# Patient Record
Sex: Male | Born: 1950 | Race: White | Hispanic: No | Marital: Married | Smoking: Former smoker
Health system: Southern US, Community
[De-identification: ages and names within clinical notes are randomized; demographics above are authoritative.]

## PROBLEM LIST (undated history)

## (undated) DIAGNOSIS — H18529 Epithelial (juvenile) corneal dystrophy, unspecified eye: Secondary | ICD-10-CM

## (undated) DIAGNOSIS — H269 Unspecified cataract: Secondary | ICD-10-CM

## (undated) DIAGNOSIS — C44320 Squamous cell carcinoma of skin of unspecified parts of face: Secondary | ICD-10-CM

## (undated) DIAGNOSIS — H1852 Epithelial (juvenile) corneal dystrophy: Secondary | ICD-10-CM

## (undated) DIAGNOSIS — J449 Chronic obstructive pulmonary disease, unspecified: Secondary | ICD-10-CM

## (undated) DIAGNOSIS — M069 Rheumatoid arthritis, unspecified: Secondary | ICD-10-CM

## (undated) DIAGNOSIS — I10 Essential (primary) hypertension: Secondary | ICD-10-CM

## (undated) DIAGNOSIS — C44329 Squamous cell carcinoma of skin of other parts of face: Secondary | ICD-10-CM

## (undated) DIAGNOSIS — E320 Persistent hyperplasia of thymus: Secondary | ICD-10-CM

## (undated) DIAGNOSIS — M1990 Unspecified osteoarthritis, unspecified site: Secondary | ICD-10-CM

## (undated) DIAGNOSIS — J45909 Unspecified asthma, uncomplicated: Secondary | ICD-10-CM

## (undated) DIAGNOSIS — C61 Malignant neoplasm of prostate: Secondary | ICD-10-CM

## (undated) DIAGNOSIS — R0600 Dyspnea, unspecified: Secondary | ICD-10-CM

## (undated) HISTORY — PX: CATARACT EXTRACTION, BILATERAL: SHX1313

## (undated) HISTORY — DX: Unspecified asthma, uncomplicated: J45.909

## (undated) HISTORY — DX: Epithelial (juvenile) corneal dystrophy, unspecified eye: H18.529

## (undated) HISTORY — DX: Unspecified osteoarthritis, unspecified site: M19.90

## (undated) HISTORY — DX: Persistent hyperplasia of thymus: E32.0

## (undated) HISTORY — DX: Unspecified cataract: H26.9

## (undated) HISTORY — DX: Rheumatoid arthritis, unspecified: M06.9

## (undated) HISTORY — PX: EYE SURGERY: SHX253

## (undated) HISTORY — DX: Squamous cell carcinoma of skin of other parts of face: C44.329

## (undated) HISTORY — PX: OTHER SURGICAL HISTORY: SHX169

## (undated) HISTORY — DX: Epithelial (juvenile) corneal dystrophy: H18.52

## (undated) HISTORY — DX: Squamous cell carcinoma of skin of unspecified parts of face: C44.320

---

## 2013-08-28 HISTORY — PX: SUPERFICIAL KERATECTOMY: SHX2459

## 2014-06-08 ENCOUNTER — Telehealth: Payer: Self-pay | Admitting: Family Medicine

## 2014-06-08 ENCOUNTER — Ambulatory Visit (INDEPENDENT_AMBULATORY_CARE_PROVIDER_SITE_OTHER): Payer: BC Managed Care – PPO | Admitting: Family Medicine

## 2014-06-08 ENCOUNTER — Encounter: Payer: Self-pay | Admitting: Family Medicine

## 2014-06-08 VITALS — BP 122/76 | HR 73 | Temp 98.6°F | Ht 71.0 in | Wt 194.0 lb

## 2014-06-08 DIAGNOSIS — K429 Umbilical hernia without obstruction or gangrene: Secondary | ICD-10-CM

## 2014-06-08 DIAGNOSIS — H1859 Other hereditary corneal dystrophies: Secondary | ICD-10-CM

## 2014-06-08 DIAGNOSIS — N401 Enlarged prostate with lower urinary tract symptoms: Secondary | ICD-10-CM

## 2014-06-08 DIAGNOSIS — N138 Other obstructive and reflux uropathy: Secondary | ICD-10-CM

## 2014-06-08 DIAGNOSIS — M069 Rheumatoid arthritis, unspecified: Secondary | ICD-10-CM

## 2014-06-08 DIAGNOSIS — H18529 Epithelial (juvenile) corneal dystrophy, unspecified eye: Secondary | ICD-10-CM

## 2014-06-08 DIAGNOSIS — H1852 Epithelial (juvenile) corneal dystrophy: Secondary | ICD-10-CM

## 2014-06-08 LAB — HEPATIC FUNCTION PANEL
ALBUMIN: 3.7 g/dL (ref 3.5–5.2)
ALK PHOS: 115 U/L (ref 39–117)
ALT: 20 U/L (ref 0–53)
AST: 25 U/L (ref 0–37)
BILIRUBIN TOTAL: 0.9 mg/dL (ref 0.2–1.2)
Bilirubin, Direct: 0.1 mg/dL (ref 0.0–0.3)
Total Protein: 6.8 g/dL (ref 6.0–8.3)

## 2014-06-08 LAB — LIPID PANEL
CHOLESTEROL: 183 mg/dL (ref 0–200)
HDL: 33.5 mg/dL — AB (ref >39.00)
LDL Cholesterol: 136 mg/dL — ABNORMAL HIGH (ref 0–99)
NonHDL: 149.5
TRIGLYCERIDES: 69 mg/dL (ref 0.0–149.0)
Total CHOL/HDL Ratio: 5
VLDL: 13.8 mg/dL (ref 0.0–40.0)

## 2014-06-08 LAB — CBC WITH DIFFERENTIAL/PLATELET
BASOS PCT: 0.6 % (ref 0.0–3.0)
Basophils Absolute: 0 10*3/uL (ref 0.0–0.1)
Eosinophils Absolute: 0.1 10*3/uL (ref 0.0–0.7)
Eosinophils Relative: 1.6 % (ref 0.0–5.0)
HCT: 44.8 % (ref 39.0–52.0)
Hemoglobin: 15 g/dL (ref 13.0–17.0)
LYMPHS ABS: 3.4 10*3/uL (ref 0.7–4.0)
Lymphocytes Relative: 40.6 % (ref 12.0–46.0)
MCHC: 33.5 g/dL (ref 30.0–36.0)
MCV: 97.9 fl (ref 78.0–100.0)
MONO ABS: 0.9 10*3/uL (ref 0.1–1.0)
Monocytes Relative: 10.2 % (ref 3.0–12.0)
Neutro Abs: 3.9 10*3/uL (ref 1.4–7.7)
Neutrophils Relative %: 47 % (ref 43.0–77.0)
Platelets: 230 10*3/uL (ref 150.0–400.0)
RBC: 4.57 Mil/uL (ref 4.22–5.81)
RDW: 12.4 % (ref 11.5–15.5)
WBC: 8.3 10*3/uL (ref 4.0–10.5)

## 2014-06-08 LAB — POCT URINALYSIS DIPSTICK
BILIRUBIN UA: NEGATIVE
Glucose, UA: NEGATIVE
KETONES UA: NEGATIVE
Leukocytes, UA: NEGATIVE
Nitrite, UA: NEGATIVE
RBC UA: NEGATIVE
Spec Grav, UA: 1.015
Urobilinogen, UA: 0.2
pH, UA: 5.5

## 2014-06-08 LAB — BASIC METABOLIC PANEL
BUN: 13 mg/dL (ref 6–23)
CHLORIDE: 104 meq/L (ref 96–112)
CO2: 25 meq/L (ref 19–32)
CREATININE: 1 mg/dL (ref 0.4–1.5)
Calcium: 9.8 mg/dL (ref 8.4–10.5)
GFR: 78.83 mL/min (ref >60.00)
Glucose, Bld: 96 mg/dL (ref 70–99)
Potassium: 4.4 mEq/L (ref 3.5–5.1)
Sodium: 136 mEq/L (ref 135–145)

## 2014-06-08 LAB — TSH: TSH: 1.47 u[IU]/mL (ref 0.35–4.50)

## 2014-06-08 LAB — PSA: PSA: 1.79 ng/mL (ref 0.10–4.00)

## 2014-06-08 NOTE — Telephone Encounter (Signed)
emmi emailed °

## 2014-06-08 NOTE — Progress Notes (Signed)
   Subjective:    Patient ID: James [name], male    DOB: Jan 14, 1951, 63 y.o.   MRN: [id number]  HPI 63 yr old male to establish with [place] and to check an umbilical hernia. He has not had a primary care physician for over 30 years. He has had the hernia for about 5 years but it is getting larger and it gets partially incarcerated at times. It is not painful but he has trouble pushing it back in at times. He has RA which is controlled by taking glucosamine supplements only. He rarely has to take some Advil for pain.    Review of Systems  Constitutional: Negative.   Respiratory: Negative.   Cardiovascular: Negative.   Gastrointestinal: Negative.   Musculoskeletal: Positive for arthralgias. Negative for joint swelling.       Objective:   Physical Exam  Constitutional: He appears well-developed and well-nourished.  Cardiovascular: Normal rate, regular rhythm, normal heart sounds and intact distal pulses.   Pulmonary/Chest: Effort normal and breath sounds normal.  Abdominal: Soft. Bowel sounds are normal. He exhibits no distension. There is no tenderness. There is no rebound and no guarding.  Small non-tender reducible umbilical hernia          Assessment & Plan:  We will refer to Surgery for the hernia. He is fasting today so we will get cpx labs. Plan to do a cpx in the next few weeks

## 2014-06-08 NOTE — Progress Notes (Signed)
Pre visit review using our clinic review tool, if applicable. No additional management support is needed unless otherwise documented below in the visit note. 

## 2014-07-15 ENCOUNTER — Ambulatory Visit (INDEPENDENT_AMBULATORY_CARE_PROVIDER_SITE_OTHER): Payer: BC Managed Care – PPO | Admitting: Family Medicine

## 2014-07-15 ENCOUNTER — Encounter: Payer: Self-pay | Admitting: Family Medicine

## 2014-07-15 VITALS — BP 118/73 | HR 75 | Temp 98.4°F | Ht 71.0 in | Wt 192.0 lb

## 2014-07-15 DIAGNOSIS — Z0000 Encounter for general adult medical examination without abnormal findings: Secondary | ICD-10-CM

## 2014-07-15 NOTE — Progress Notes (Signed)
   Subjective:    Patient ID: James [name], male    DOB: June 28, 1951, 63 y.o.   MRN: [id number]  HPI 63 yr old male for a cpx. He feels well. He is scheduled for surgery to repair his umbilical hernia per Dr. Rosendo Gros on 08-07-14.    Review of Systems  Constitutional: Negative.   HENT: Negative.   Eyes: Negative.   Respiratory: Negative.   Cardiovascular: Negative.   Gastrointestinal: Negative.   Genitourinary: Negative.   Musculoskeletal: Negative.   Skin: Negative.   Neurological: Negative.   Psychiatric/Behavioral: Negative.        Objective:   Physical Exam  Constitutional: He is oriented to person, place, and time. He appears well-developed and well-nourished. No distress.  HENT:  Head: Normocephalic and atraumatic.  Right Ear: External ear normal.  Left Ear: External ear normal.  Nose: Nose normal.  Mouth/Throat: Oropharynx is clear and moist. No oropharyngeal exudate.  Eyes: Conjunctivae and EOM are normal. Pupils are equal, round, and reactive to light. Right eye exhibits no discharge. Left eye exhibits no discharge. No scleral icterus.  Neck: Neck supple. No JVD present. No tracheal deviation present. No thyromegaly present.  Cardiovascular: Normal rate, regular rhythm, normal heart sounds and intact distal pulses.  Exam reveals no gallop and no friction rub.   No murmur heard. EKG normal   Pulmonary/Chest: Effort normal and breath sounds normal. No respiratory distress. He has no wheezes. He has no rales. He exhibits no tenderness.  Abdominal: Soft. Bowel sounds are normal. He exhibits no distension. There is no tenderness. There is no rebound and no guarding.  Small reducible non-tender umbilical hernia   Genitourinary: Rectum normal, prostate normal and penis normal. Guaiac negative stool. No penile tenderness.  Musculoskeletal: Normal range of motion. He exhibits no edema or tenderness.  Lymphadenopathy:    He has no cervical adenopathy.  Neurological: He  is alert and oriented to person, place, and time. He has normal reflexes. No cranial nerve deficit. He exhibits normal muscle tone. Coordination normal.  Skin: Skin is warm and dry. No rash noted. He is not diaphoretic. No erythema. No pallor.  Psychiatric: He has a normal mood and affect. His behavior is normal. Judgment and thought content normal.          Assessment & Plan:  Well exam. Set up a colonoscopy

## 2014-07-15 NOTE — Progress Notes (Signed)
Pre visit review using our clinic review tool, if applicable. No additional management support is needed unless otherwise documented below in the visit note. 

## 2016-01-06 DIAGNOSIS — S50861A Insect bite (nonvenomous) of right forearm, initial encounter: Secondary | ICD-10-CM | POA: Diagnosis not present

## 2016-05-22 NOTE — Progress Notes (Signed)
[name] [place], [place] [address] Phone: [phone] Subjective:    I'm seeing this patient by the request  of: FRY,STEPHEN A, MD    CC: Left shoulder pain  [id number]  James [name] is a 65 y.o. male coming in with complaint of left shoulder pain. Patient was moving a treadmill down the stairs. Went and pivoted and felt a sharp pain in his left shoulder. Past medical history significant for a clavicle fracture noted and 20 years ago on this side. Patient states that unfortunately continues to have discomfort. States that certain movements, severe pain. Patient denies any radiation down the arm or any numbness. An some mild weakness but very minimal. Patient rates the severity of pain a 7 out of 10. Can wake him up at night.      Past Medical History:  Diagnosis Date  . Anterior basement membrane dystrophy    sees Dr. Vevelyn Royals   . Asthma    as a child, resolved   . Cataract   . 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 Rheumatoid arthritis   . 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 CATARACT EXTRACTION, BILATERAL     per Dr. Tommy Rainwater   . sqamous cell     right eyebrow excised   . SUPERFICIAL KERATECTOMY Right 2015   per Dr. Lucita Ferrara    Social History   Social History  . Marital status: Married    Spouse name: N/A  . Number of children: N/A  . Years of education: N/A   Social History Main Topics  . Smoking status: Current Every Day Smoker    Packs/day: 1.00    Types: Cigarettes  . Smokeless tobacco: Never Used  . Alcohol use 0.0 oz/week     Comment: rare  . Drug use: No  . Sexual activity: Not on file   Other Topics Concern  . Not on file   Social History Narrative  . No narrative on file   Allergies  Allergen Reactions  . Penicillins     Respiratory reaction SOB   Family History    Problem Relation Age of Onset  . Uterine cancer Mother   . Breast cancer Mother     Past medical history, social, surgical and family history all reviewed in electronic medical record.  No pertanent information unless stated regarding to the chief complaint.   Review of Systems: No headache, visual changes, nausea, vomiting, diarrhea, constipation, dizziness, abdominal pain, skin rash, fevers, chills, night sweats, weight loss, swollen lymph nodes, body aches, joint swelling, muscle aches, chest pain, shortness of breath, mood changes.   Objective  There were no vitals taken for this visit.  General: No apparent distress alert and oriented x3 mood and affect normal, dressed appropriately.  HEENT: Pupils equal, extraocular movements intact  Respiratory: Patient's speak in full sentences and does not appear short of breath  Cardiovascular: No lower extremity edema, non tender, no erythema  Skin: Warm dry intact with no signs of infection or rash on extremities or on axial skeleton.  Abdomen: Soft nontender  Neuro: Cranial nerves II through XII are intact, neurovascularly intact in all extremities with 2+ DTRs and 2+ pulses.  Lymph: No lymphadenopathy of posterior or anterior cervical chain or axillae bilaterally.  Gait normal with good balance and coordination.  MSK:  Non tender with full range of motion and good stability and symmetric strength and tone of elbows, wrist, hip, knee and ankles bilaterally.  Shoulder: left Inspection reveals no abnormalities, atrophy or asymmetry. Palpation is normal with no tenderness over AC joint or bicipital groove. ROM is full in all planes passively. Rotator cuff strength normal throughout. signs of impingement with positive Neer and Hawkin's tests, but negative empty can sign. Speeds and Yergason's tests normal. No labral pathology noted with negative Obrien's, negative clunk and good stability. Positive crossover test Normal scapular function  observed. No painful arc and no drop arm sign. No apprehension sign  MSK US performed of: left This study was ordered, performed, and interpreted by Charlann Boxer D.O.  Shoulder:   Supraspinatus:  Degenerative tear noted. No retraction. Mild increase in Doppler flow Infraspinatus:  Appears normal on long and transverse views. Significant increase in Doppler flow Subscapularis:  Appears normal on long and transverse views. Positive bursa Teres Minor:  Appears normal on long and transverse views. AC joint:  Severe capsulitis noted with hypoechoic changes Glenohumeral Joint:  Appears normal without effusion. Glenoid Labrum:  Intact without visualized tears. Biceps Tendon:  Appears normal on long and transverse views, no fraying of tendon, tendon located in intertubercular groove, no subluxation with shoulder internal or external rotation.  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  Procedure note [id number]; 15 minutes spent for Therapeutic exercises as stated in above notes.  This included exercises focusing on stretching, strengthening, with significant focus on eccentric aspects.  Shoulder Exercises that included:  Basic scapular stabilization to include adduction and depression of scapula Scaption, focusing on proper movement and good control Internal and External rotation utilizing a theraband, with elbow tucked at side entire time  Rows with theraband   Proper technique shown and discussed handout in great detail with ATC.  All questions were discussed and answered.     Impression and Recommendations:     This case required medical decision making of moderate complexity.      Note: This dictation was prepared with Dragon dictation along with smaller phrase technology. Any transcriptional errors that result from this process are unintentional.

## 2016-05-23 ENCOUNTER — Other Ambulatory Visit: Payer: Self-pay

## 2016-05-23 ENCOUNTER — Ambulatory Visit (INDEPENDENT_AMBULATORY_CARE_PROVIDER_SITE_OTHER): Payer: BLUE CROSS/BLUE SHIELD | Admitting: Family Medicine

## 2016-05-23 ENCOUNTER — Encounter: Payer: Self-pay | Admitting: Family Medicine

## 2016-05-23 VITALS — BP 136/80 | HR 78 | Wt 192.0 lb

## 2016-05-23 DIAGNOSIS — M25512 Pain in left shoulder: Secondary | ICD-10-CM

## 2016-05-23 DIAGNOSIS — M25519 Pain in unspecified shoulder: Secondary | ICD-10-CM | POA: Diagnosis not present

## 2016-05-23 DIAGNOSIS — M75102 Unspecified rotator cuff tear or rupture of left shoulder, not specified as traumatic: Secondary | ICD-10-CM | POA: Diagnosis not present

## 2016-05-23 DIAGNOSIS — M7542 Impingement syndrome of left shoulder: Secondary | ICD-10-CM

## 2016-05-23 NOTE — Assessment & Plan Note (Signed)
Patient does have what appears to be acromial clavicular separation. Patient has elected try conservative therapy. Work with Product/process development scientist to learn home exercises in greater detail. Declined injection today. We discussed icing and given topical anti-inflammatories. Patient continuing to stay active. Patient come back and see me again in 3-4 weeks. Forcing symptoms consider injection.

## 2016-05-23 NOTE — Patient Instructions (Signed)
Good to see you.  Ice 20 minutes 2 times daily. Usually after activity and before bed. Exercises 3 times a week.  pennsaid pinkie amount topically 2 times daily as needed.  Vitamin D 2000 IU daily  Turmeric 500mg  twice daily  Keep hands within peripheral vision.  Avoid heavy lifting See me again in 3-4 weeks and if worsening we will consider injection.

## 2016-06-13 NOTE — Progress Notes (Signed)
[name] [place], [place] [address] Phone: [phone] Subjective:    I'm seeing this patient by the request  of: FRY,STEPHEN A, MD    CC: Left shoulder pain f/u  [id number]  James [name] is a 65 y.o. male coming in with complaint of left shoulder pain. Patient was found to have a degenerative tear of the meniscus as well as acromial clavicular arthritis with synovitis. Patient elected to try conservative therapy with home exercise, icing protocol, and range of motion exercises. Past medical history significant for rheumatoid arthritis. Patient states still having the pain. Did not do the exercises at all. Has been doing the icing and some of the topical anti-inflammatories. Minimal change from previous exam.      Past Medical History:  Diagnosis Date  . Anterior basement membrane dystrophy    sees Dr. Vevelyn Royals   . Asthma    as a child, resolved   . Cataract   . 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 Rheumatoid arthritis ([place])   . 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 CATARACT EXTRACTION, BILATERAL     per Dr. Tommy Rainwater   . sqamous cell     right eyebrow excised   . SUPERFICIAL KERATECTOMY Right 2015   per Dr. Lucita Ferrara    Social History   Social History  . Marital status: Married    Spouse name: N/A  . Number of children: N/A  . Years of education: N/A   Social History Main Topics  . Smoking status: Current Every Day Smoker    Packs/day: 1.00    Types: Cigarettes  . Smokeless tobacco: Never Used  . Alcohol use 0.0 oz/week     Comment: rare  . Drug use: No  . Sexual activity: Not Asked   Other Topics Concern  . None   Social History Narrative  . None   Allergies  Allergen Reactions  . Penicillins     Respiratory reaction SOB   Family History  Problem Relation Age of  Onset  . Uterine cancer Mother   . Breast cancer Mother     Past medical history, social, surgical and family history all reviewed in electronic medical record.  No pertanent information unless stated regarding to the chief complaint.   Review of Systems: No headache, visual changes, nausea, vomiting, diarrhea, constipation, dizziness, abdominal pain, skin rash, fevers, chills, night sweats, weight loss, swollen lymph nodes, body aches, joint swelling, muscle aches, chest pain, shortness of breath, mood changes.   Objective  Blood pressure 122/72, pulse 77, weight 188 lb (85.3 kg), SpO2 97 %.  General: No apparent distress alert and oriented x3 mood and affect normal, dressed appropriately.  HEENT: Pupils equal, extraocular movements intact  Respiratory: Patient's speak in full sentences and does not appear short of breath  Cardiovascular: No lower extremity edema, non tender, no erythema  Skin: Warm dry intact with no signs of infection or rash on extremities or on axial skeleton.  Abdomen: Soft nontender  Neuro: Cranial nerves II through XII are intact, neurovascularly intact in all extremities with 2+ DTRs and 2+ pulses.  Lymph: No lymphadenopathy of posterior or anterior cervical chain or axillae bilaterally.  Gait normal with good balance and coordination.  MSK:  Non tender with full range of motion and good stability  and symmetric strength and tone of elbows, wrist, hip, knee and ankles bilaterally.  Shoulder: left Inspection reveals no abnormalities, atrophy or asymmetry. Increasing tenderness over the acromial clavicular joint Full range of motion but does have crepitus 4+ out of 5 compared to contralateral side. New finding signs of impingement with positive Neer and Hawkin's tests, but negative empty can sign. Speeds and Yergason's tests normal. No labral pathology noted with negative Obrien's, negative clunk and good stability. Positive crossover test Normal scapular function  observed. No painful arc and no drop arm sign. No apprehension sign Contralateral shoulder unremarkable  MSK US performed of: left This study was ordered, performed, and interpreted by Charlann Boxer D.O.  Shoulder:   Supraspinatus:  Degenerative tear noted. No retraction. Mild increase in Doppler flow Infraspinatus:  Appears normal on long and transverse views. Significant increase in Doppler flow Subscapularis:  Appears normal on long and transverse views. Positive bursa Teres Minor:  Appears normal on long and transverse views. AC joint:  Severe capsulitis noted with hypoechoic changes Glenohumeral Joint:  Appears normal without effusion. Glenoid Labrum:  Intact without visualized tears. Biceps Tendon:  Appears normal on long and transverse views, no fraying of tendon, tendon located in intertubercular groove, no subluxation with shoulder internal or external rotation.  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   Procedure: Real-time Ultrasound Guided Injection of left acromioclavicular joint Device: GE Logiq E  Ultrasound guided injection is preferred based studies that show increased duration, increased effect, greater accuracy, decreased procedural pain, increased response rate, and decreased cost with ultrasound guided versus blind injection.  Verbal informed consent obtained.  Time-out conducted.  Noted no overlying erythema, induration, or other signs of local infection.  Skin prepped in a sterile fashion.  Local anesthesia: Topical Ethyl chloride.  With sterile technique and under real time ultrasound guidance:  With a 25-gauge half-inch needle was injected with a total of 0.5 mL of 0.5% Marcaine and 0.5 mL of Kenalog 40 mg/dL. Completed without difficulty  Pain immediately resolved suggesting accurate placement of the medication.  Advised to call if fevers/chills, erythema, induration, drainage, or persistent  bleeding.  Images permanently stored and available for review in the ultrasound unit.  Impression: Technically successful ultrasound guided injection.   Impression and Recommendations:     This case required medical decision making of moderate complexity.      Note: This dictation was prepared with Dragon dictation along with smaller phrase technology. Any transcriptional errors that result from this process are unintentional.

## 2016-06-14 ENCOUNTER — Ambulatory Visit (INDEPENDENT_AMBULATORY_CARE_PROVIDER_SITE_OTHER)
Admission: RE | Admit: 2016-06-14 | Discharge: 2016-06-14 | Disposition: A | Discharge status: Home / Self Care | Payer: BLUE CROSS/BLUE SHIELD | Source: Ambulatory Visit | Attending: Family Medicine | Admitting: Family Medicine

## 2016-06-14 ENCOUNTER — Ambulatory Visit: Payer: Self-pay

## 2016-06-14 ENCOUNTER — Ambulatory Visit (INDEPENDENT_AMBULATORY_CARE_PROVIDER_SITE_OTHER): Payer: BLUE CROSS/BLUE SHIELD | Admitting: Family Medicine

## 2016-06-14 ENCOUNTER — Encounter: Payer: Self-pay | Admitting: Family Medicine

## 2016-06-14 VITALS — BP 122/72 | HR 77 | Wt 188.0 lb

## 2016-06-14 DIAGNOSIS — M25512 Pain in left shoulder: Secondary | ICD-10-CM

## 2016-06-14 DIAGNOSIS — M7542 Impingement syndrome of left shoulder: Secondary | ICD-10-CM | POA: Diagnosis not present

## 2016-06-14 DIAGNOSIS — S4992XA Unspecified injury of left shoulder and upper arm, initial encounter: Secondary | ICD-10-CM | POA: Diagnosis not present

## 2016-06-14 IMAGING — DX DG SHOULDER 2+V*L*
3 series · 3 of 3 positions shown · non-contrast
Comparison: None.

CLINICAL DATA: Pain following injury 2 months prior.

EXAM:
LEFT SHOULDER - 2+ VIEW

[grashey]
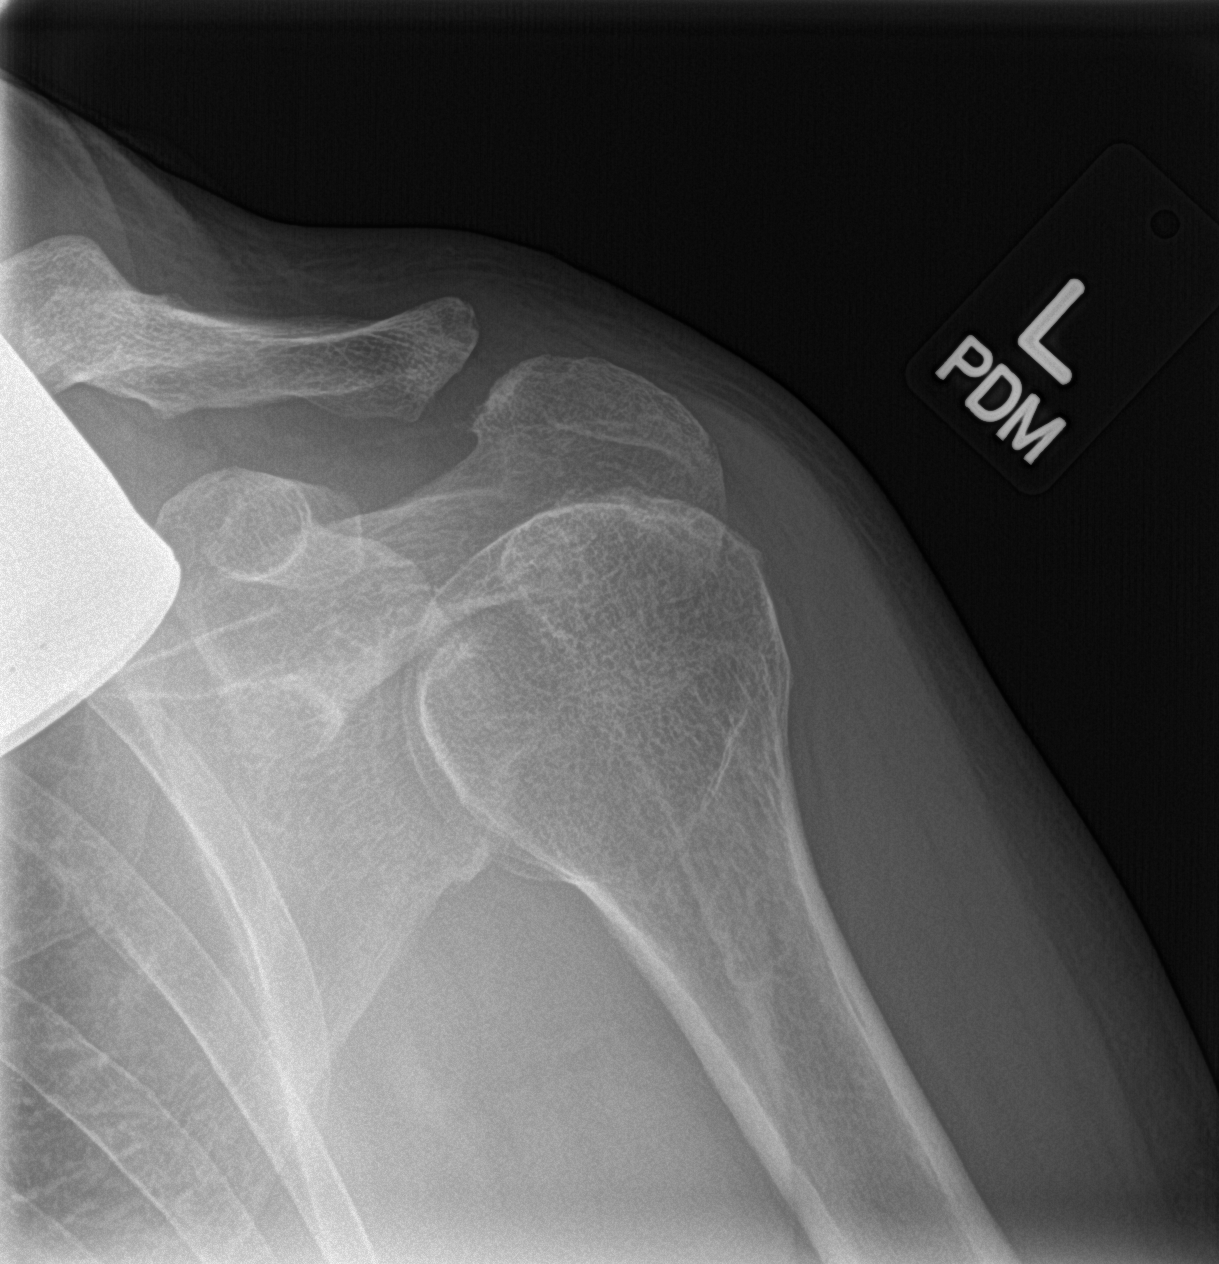

[y view]
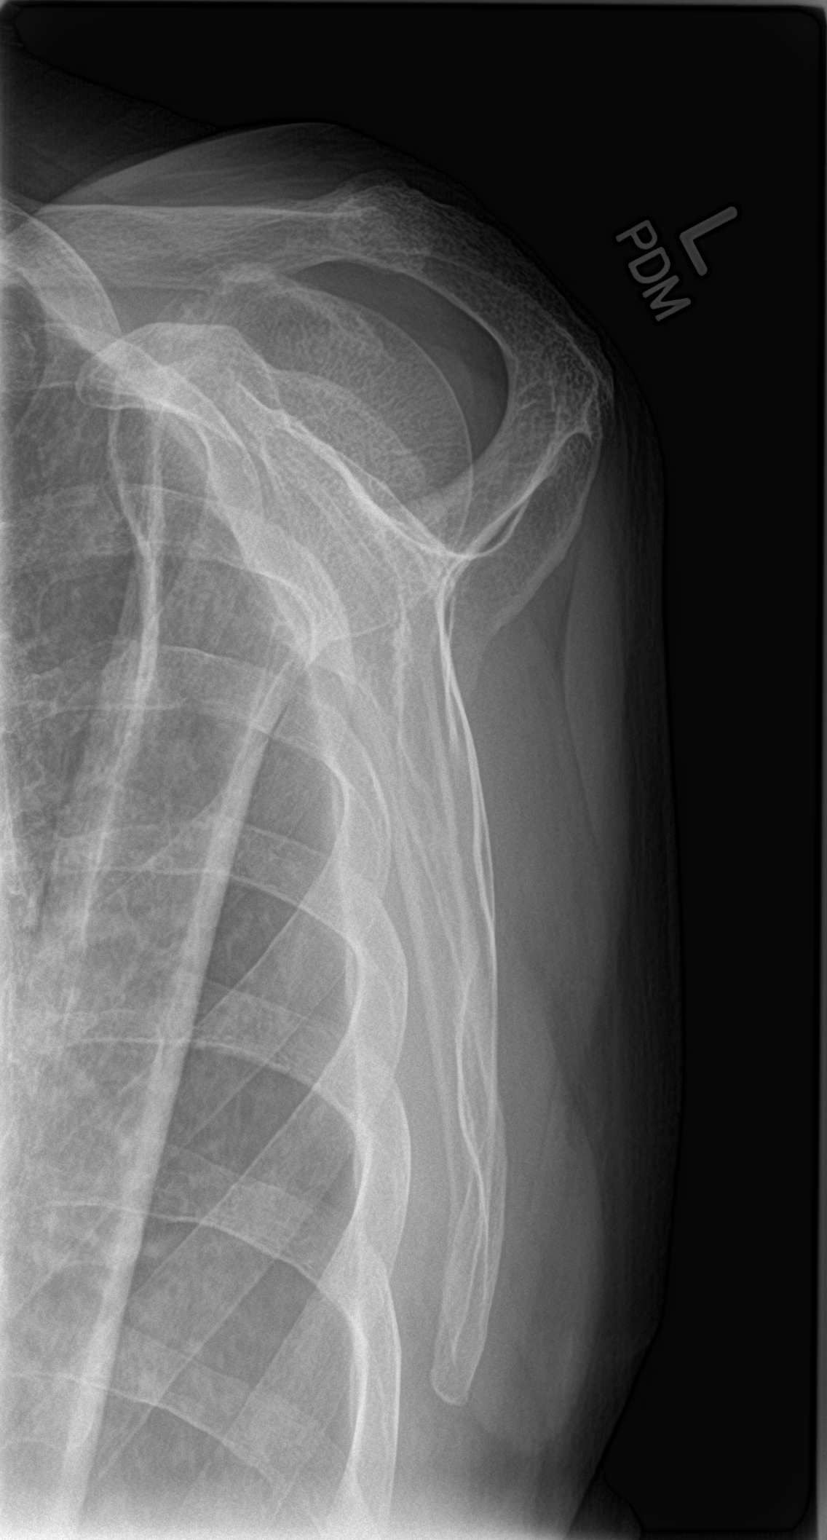

[shoulder axial]
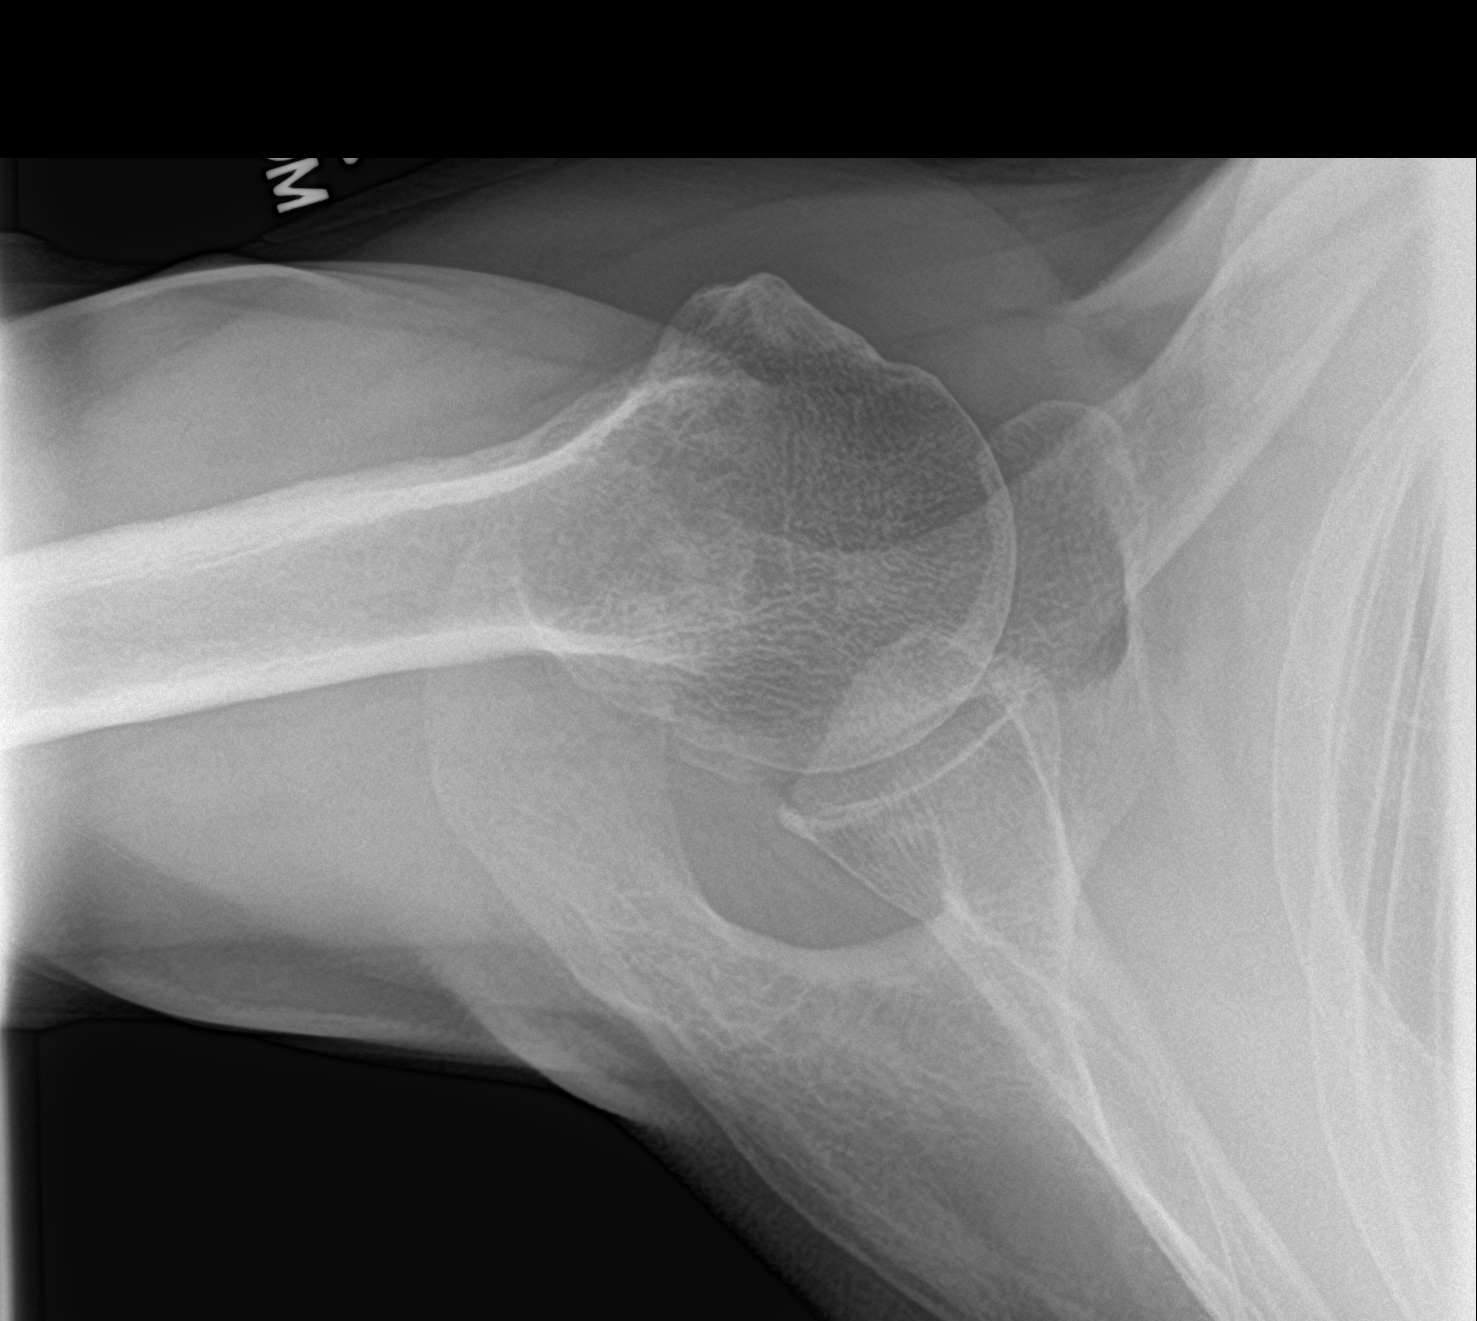

[3 of 3 positions shown; findings below may reference images not displayed]

FINDINGS: Frontal, Y scapular, and axillary images were obtained. There is no
fracture or dislocation. Joint spaces appear unremarkable. No
erosive change. Visualized left lung clear.
IMPRESSION: Fracture or dislocation.  No evident arthropathy.

## 2016-06-14 NOTE — Assessment & Plan Note (Signed)
Given injection today and tolerated the procedure well. We discussed icing regimen and home exercises. We discussed which activities to do a which ones to avoid. Patient will continue to stay active. Follow-up again in 4 weeks. If worsening symptoms consider formal physical therapy or intra-articular injection secondary to rotator cuff degenerative tear.  If weakness would need advance imaging.

## 2016-06-14 NOTE — Patient Instructions (Addendum)
Good to see you  We will get xray downstairs Try to be a little more diligently on the exercises Injected AC joint today and may need the shoulder injected in the long run.  See me again in 4 weeks.

## 2016-07-11 NOTE — Progress Notes (Signed)
[name] [place], [place] [address] Phone: [phone] Subjective:    I'm seeing this patient by the request  of: FRY,STEPHEN A, MD    CC: Left shoulder pain f/u  [id number]  James [name] is a 65 y.o. male coming in with complaint of left shoulder pain. Patient was found to have a degenerative tear of the Rotator cuff as well as acromial clavicular arthritis with synovitis. Patient will remain mild improvement with conservative therapy and then elected to have an acromioclavicular injection. Patient states mild improvement.  No new symptoms still hurt with some activity Still doing some discomfort when having pain at night.       Past Medical History:  Diagnosis Date  . Anterior basement membrane dystrophy    sees Dr. Vevelyn Royals   . Asthma    as a child, resolved   . Cataract   . 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 Rheumatoid arthritis ([place])   . 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 CATARACT EXTRACTION, BILATERAL     per Dr. Tommy Rainwater   . sqamous cell     right eyebrow excised   . SUPERFICIAL KERATECTOMY Right 2015   per Dr. Lucita Ferrara    Social History   Social History  . Marital status: Married    Spouse name: N/A  . Number of children: N/A  . Years of education: N/A   Social History Main Topics  . Smoking status: Current Every Day Smoker    Packs/day: 1.00    Types: Cigarettes  . Smokeless tobacco: Never Used  . Alcohol use 0.0 oz/week     Comment: rare  . Drug use: No  . Sexual activity: Not Asked   Other Topics Concern  . None   Social History Narrative  . None   Allergies  Allergen Reactions  . Penicillins     Respiratory reaction SOB   Family History  Problem Relation Age of Onset  . Uterine cancer Mother   . Breast cancer Mother     Past medical history,  social, surgical and family history all reviewed in electronic medical record.  No pertanent information unless stated regarding to the chief complaint.   Review of Systems: No headache, visual changes, nausea, vomiting, diarrhea, constipation, dizziness, abdominal pain, skin rash, fevers, chills, night sweats, weight loss, swollen lymph nodes, body aches, joint swelling, muscle aches, chest pain, shortness of breath, mood changes.   Objective  Blood pressure 128/74, pulse 81, height 5\' 10"  (1.778 m), weight 190 lb (86.2 kg), SpO2 95 %.  Systems examined below as of 07/12/16 General: NAD A&O x3 mood, affect normal  HEENT: Pupils equal, extraocular movements intact no nystagmus Respiratory: not short of breath at rest or with speaking Cardiovascular: No lower extremity edema, non tender Skin: Warm dry intact with no signs of infection or rash on extremities or on axial skeleton. Abdomen: Soft nontender, no masses Neuro: Cranial nerves  intact, neurovascularly intact in all extremities with 2+ DTRs and 2+ pulses. Lymph: No lymphadenopathy appreciated today  Gait normal with good balance and coordination.  MSK: Non tender with full range of motion and good stability and symmetric strength and tone of elbows, wrist,  knee hips and ankles bilaterally.   Shoulder: left Patient does have asymmetry of the clavicle on  the left side Significant decrease tenderness over the acromioclavicular joint Full range of motion but does have crepitus 4+ out of 5 compared to contralateral side. . No significant improvement signs of impingement with positive Neer and Hawkin's tests, but negative empty can sign. This is worse than previous exam Speeds and Yergason's tests normal. No labral pathology noted with negative Obrien's, negative clunk and good stability. Positive crossover test Normal scapular function observed. No painful arc and no drop arm sign. No apprehension sign Contralateral shoulder  unremarkable Worsening impingement signs  Procedure: Real-time Ultrasound Guided Injection of left glenohumeral joint Device: GE Logiq E  Ultrasound guided injection is preferred based studies that show increased duration, increased effect, greater accuracy, decreased procedural pain, increased response rate with ultrasound guided versus blind injection.  Verbal informed consent obtained.  Time-out conducted.  Noted no overlying erythema, induration, or other signs of local infection.  Skin prepped in a sterile fashion.  Local anesthesia: Topical Ethyl chloride.  With sterile technique and under real time ultrasound guidance:  Joint visualized.  23g 1  inch needle inserted posterior approach. Pictures taken for needle placement. Patient did have injection of 2 cc of 1% lidocaine, 2 cc of 0.5% Marcaine, and 1cc of Kenalog 40 mg/dL. Completed without difficulty  Pain immediately resolved suggesting accurate placement of the medication.  Advised to call if fevers/chills, erythema, induration, drainage, or persistent bleeding.  Images permanently stored and available for review in the ultrasound unit.  Impression: Technically successful ultrasound guided injection.     Impression and Recommendations:     This case required medical decision making of moderate complexity.      Note: This dictation was prepared with Dragon dictation along with smaller phrase technology. Any transcriptional errors that result from this process are unintentional.

## 2016-07-12 ENCOUNTER — Encounter: Payer: Self-pay | Admitting: Family Medicine

## 2016-07-12 ENCOUNTER — Ambulatory Visit (INDEPENDENT_AMBULATORY_CARE_PROVIDER_SITE_OTHER): Payer: BLUE CROSS/BLUE SHIELD | Admitting: Family Medicine

## 2016-07-12 ENCOUNTER — Ambulatory Visit: Payer: Self-pay

## 2016-07-12 VITALS — BP 128/74 | HR 81 | Ht 70.0 in | Wt 190.0 lb

## 2016-07-12 DIAGNOSIS — M7542 Impingement syndrome of left shoulder: Secondary | ICD-10-CM

## 2016-07-12 DIAGNOSIS — M25512 Pain in left shoulder: Secondary | ICD-10-CM | POA: Diagnosis not present

## 2016-07-12 NOTE — Assessment & Plan Note (Signed)
On exam patient seemed to be improving with this. Unfortunately having worsening impingement. I do believe that this is contributing to most of his discomfort. Patient does have some underlying osteophytic changes of the shoulder that likely also be playing a role. Patient has a history of rheumatoid arthritis and will continue to monitor. Discussed the over-the-counter medications in the topical anti-inflammatories. Patient and will come back and see me again in 4 weeks for further evaluation. Worsening symptoms consider formal physical therapy or MRI.

## 2016-07-12 NOTE — Assessment & Plan Note (Addendum)
Worsening symptoms. Patient given injection today, discussed icing regimen and home exercises, we discussed which activities to do in which ones to potentially avoid. Patient will continue to be active otherwise. Patient will follow-up with me again in 4-6 weeks. Worsening symptoms advance imaging or formal physical therapy could be warranted.

## 2016-07-12 NOTE — Patient Instructions (Signed)
Good to see you  Lets try this other injection.  Stay with the exercises 3 times a week, I like discomfort but not pain  I would only ice after a lot of activity  Have a great [place] day  See me again in 4-6 weeks to make sure doing well.

## 2016-08-08 NOTE — Progress Notes (Signed)
[name] [place], [place] [address] Phone: [phone] Subjective:    I'm seeing this patient by the request  of: FRY,STEPHEN A, MD    CC: Left shoulder pain f/u  [id number]  James [name] is a 65 y.o. male coming in with complaint of left shoulder pain. Patient was found to have a degenerative tear of the Rotator cuff as well as acromial clavicular arthritis with synovitis. Patient did have acromioclavicular injection 06/14/2016. Continued have pain and injected shoulder 07/12/2016. Patient was to do conservative therapy. Patient states better still mild pain, mild weakness compared to contralateral side      previous x-rays show no significant bony abnormalities of the left shoulder. These were independently visualized by me.  Past Medical History:  Diagnosis Date  . Anterior basement membrane dystrophy    sees Dr. Vevelyn Royals   . Asthma    as a child, resolved   . Cataract   . 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 Rheumatoid arthritis ([place])   . 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 CATARACT EXTRACTION, BILATERAL     per Dr. Tommy Rainwater   . sqamous cell     right eyebrow excised   . SUPERFICIAL KERATECTOMY Right 2015   per Dr. Lucita Ferrara    Social History   Social History  . Marital status: Married    Spouse name: N/A  . Number of children: N/A  . Years of education: N/A   Social History Main Topics  . Smoking status: Current Every Day Smoker    Packs/day: 1.00    Types: Cigarettes  . Smokeless tobacco: Never Used  . Alcohol use 0.0 oz/week     Comment: rare  . Drug use: No  . Sexual activity: Not Asked   Other Topics Concern  . None   Social History Narrative  . None   Allergies  Allergen Reactions  . Penicillins     Respiratory reaction SOB   Family History  Problem  Relation Age of Onset  . Uterine cancer Mother   . Breast cancer Mother     Past medical history, social, surgical and family history all reviewed in electronic medical record.  No pertanent information unless stated regarding to the chief complaint.   Review of Systems: No headache, visual changes, nausea, vomiting, diarrhea, constipation, dizziness, abdominal pain, skin rash, fevers, chills, night sweats, weight loss, swollen lymph nodes, body aches, joint swelling, muscle aches, chest pain, shortness of breath, mood changes.     Objective  Blood pressure 128/80, pulse 80, height 5\' 10"  (1.778 m), weight 189 lb (85.7 kg), SpO2 95 %.  Systems examined below as of 08/09/16 General: NAD A&O x3 mood, affect normal  HEENT: Pupils equal, extraocular movements intact no nystagmus Respiratory: not short of breath at rest or with speaking Cardiovascular: No lower extremity edema, non tender Skin: Warm dry intact with no signs of infection or rash on extremities or on axial skeleton. Abdomen: Soft nontender, no masses Neuro: Cranial nerves  intact, neurovascularly intact in all extremities with 2+ DTRs and 2+ pulses. Lymph: No lymphadenopathy appreciated today  Gait normal with good balance and coordination.  MSK: Non tender with full range of motion and good stability and symmetric strength and tone of  elbows, wrist,  knee hips and ankles  bilaterally.    Shoulder: left Patient does have asymmetry of the clavicle on the left side Mild pain over the acromioclavicular joint.  4+ out of 5 compared to contralateral side. Marland Kitchen No change in stregth signs of impingement with positive Neer and Hawkin's tests, but negative empty can sign. This is worse than previous exam Speeds and Yergason's tests normal. No labral pathology noted with negative Obrien's, negative clunk and good stability. Positive crossover test still present.  Normal scapular function observed. No painful arc and no drop arm  sign. No apprehension sign Contralateral shoulder unremarkable        Impression and Recommendations:     This case required medical decision making of moderate complexity.      Note: This dictation was prepared with Dragon dictation along with smaller phrase technology. Any transcriptional errors that result from this process are unintentional.

## 2016-08-09 ENCOUNTER — Ambulatory Visit (INDEPENDENT_AMBULATORY_CARE_PROVIDER_SITE_OTHER): Payer: BLUE CROSS/BLUE SHIELD | Admitting: Family Medicine

## 2016-08-09 ENCOUNTER — Encounter: Payer: Self-pay | Admitting: Family Medicine

## 2016-08-09 DIAGNOSIS — M7542 Impingement syndrome of left shoulder: Secondary | ICD-10-CM | POA: Diagnosis not present

## 2016-08-09 MED ORDER — DICLOFENAC SODIUM 2 % TD SOLN: TRANSDERMAL | 3 refills | Status: DC

## 2016-08-09 NOTE — Assessment & Plan Note (Signed)
Partial tearing, OA and RA causing pain  Will continue therapy conservative.  Can repeat injection as needed every 3 months, likely not a surgical candidate due to OA.  F/u again in 2 months

## 2016-08-09 NOTE — Patient Instructions (Signed)
Good to see you  Ice 20 minutes 2 times daily. Usually after activity and before bed. Stay active but keep hands within peripheral vision.  Avoid heavy lifting. pennsaid pinkie amount topically 2 times daily as needed.   You will do well but we may need to inject in 8 weeks if it worsens.

## 2016-09-05 ENCOUNTER — Telehealth: Payer: Self-pay | Admitting: Family Medicine

## 2016-09-05 MED ORDER — DICLOFENAC SODIUM 2 % TD SOLN: TRANSDERMAL | 3 refills | Status: DC

## 2016-09-05 NOTE — Telephone Encounter (Signed)
Pt request to speak with the assistant concern about pennsaid. Please give him a call back

## 2016-09-05 NOTE — Telephone Encounter (Signed)
Re-sent rx into pharmacy.  

## 2016-10-09 NOTE — Progress Notes (Signed)
[name] [place], [place] [address] Phone: [phone] Subjective:    I'm seeing this patient by the request  of: James Penna, MD    CC: Left shoulder pain f/u  [id number]  James [name] is a 66 y.o. male coming in with complaint of left shoulder pain. Patient was found to have a degenerative tear of the Rotator cuff as well as acromial clavicular arthritis with synovitis. Patient did have acromioclavicular injection 06/14/2016. Continued have pain and injected shoulder 07/12/2016. Patient was to do conservative therapy. Update 10/10/16-  Patient seems to be doing relatively well. Patient states that he is having some mild pain. Nothing severe. Patient is starting to increase activity on a more regular basis. Patient states that he has tried to start doing some strengthening with some mild discomfort but nothing severe. Not affecting daily activities.      Past Medical History:  Diagnosis Date  . Anterior basement membrane dystrophy    sees Dr. Vevelyn Royals   . Asthma    as a child, resolved   . Cataract   . 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 Rheumatoid arthritis ([place])   . 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 CATARACT EXTRACTION, BILATERAL     per Dr. Tommy Rainwater   . sqamous cell     right eyebrow excised   . SUPERFICIAL KERATECTOMY Right 2015   per Dr. Lucita Ferrara    Social History   Social History  . Marital status: Married    Spouse name: N/A  . Number of children: N/A  . Years of education: N/A   Social History Main Topics  . Smoking status: Current Every Day Smoker    Packs/day: 1.00    Types: Cigarettes  . Smokeless tobacco: Never Used  . Alcohol use 0.0 oz/week     Comment: rare  . Drug use: No  . Sexual activity: Not Asked   Other Topics Concern  . None   Social History  Narrative  . None   Allergies  Allergen Reactions  . Penicillins     Respiratory reaction SOB   Family History  Problem Relation Age of Onset  . Uterine cancer Mother   . Breast cancer Mother     Past medical history, social, surgical and family history all reviewed in electronic medical record.  No pertanent information unless stated regarding to the chief complaint.   Review of Systems: No headache, visual changes, nausea, vomiting, diarrhea, constipation, dizziness, abdominal pain, skin rash, fevers, chills, night sweats, weight loss, swollen lymph nodes, body aches, joint swelling, muscle aches, chest pain, shortness of breath, mood changes.       Objective  Blood pressure 108/78, pulse 76, height 5\' 10"  (1.778 m), weight 195 lb (88.5 kg), SpO2 98 %.  Systems examined below as of 10/10/16 General: NAD A&O x3 mood, affect normal  HEENT: Pupils equal, extraocular movements intact no nystagmus Respiratory: not short of breath at rest or with speaking Cardiovascular: No lower extremity edema, non tender Skin: Warm dry intact with no signs of infection or rash on extremities or on axial skeleton. Abdomen: Soft nontender, no masses Neuro: Cranial nerves  intact, neurovascularly intact in all extremities with 2+ DTRs and 2+ pulses. Lymph: No lymphadenopathy appreciated today  Gait normal with good balance and coordination.  MSK: Non tender with full range of motion and good stability and symmetric strength and tone of , elbows, wrist,  knee hips and ankles bilaterally.      Shoulder: Inspection reveals no abnormalities, atrophy or asymmetry. Palpation is normal with no tenderness over AC joint or bicipital groove. ROM is full in all planes. Rotator cuff strength 4+ out of 5 compared to contralateral sign. No signs of impingement with negative Neer and Hawkin's tests, empty can sign. Speeds and Yergason's tests normal. Mild positive labral pathology patient does have positive  crossover Normal scapular function observed. No painful arc and no drop arm sign. No apprehension sign           Impression and Recommendations:     This case required medical decision making of moderate complexity.      Note: This dictation was prepared with Dragon dictation along with smaller phrase technology. Any transcriptional errors that result from this process are unintentional.

## 2016-10-10 ENCOUNTER — Ambulatory Visit (INDEPENDENT_AMBULATORY_CARE_PROVIDER_SITE_OTHER): Payer: BLUE CROSS/BLUE SHIELD | Admitting: Family Medicine

## 2016-10-10 ENCOUNTER — Encounter: Payer: Self-pay | Admitting: Family Medicine

## 2016-10-10 VITALS — BP 108/78 | HR 76 | Ht 70.0 in | Wt 195.0 lb

## 2016-10-10 DIAGNOSIS — M25512 Pain in left shoulder: Secondary | ICD-10-CM | POA: Diagnosis not present

## 2016-10-10 DIAGNOSIS — M7542 Impingement syndrome of left shoulder: Secondary | ICD-10-CM

## 2016-10-10 NOTE — Assessment & Plan Note (Signed)
Patient seems to be doing relatively well. We discussed the possibility of a repeat injection patient declined. We discussed icing regimen. We discussed continuing exercises and patient given more specific strengthening exercises with a very slow increasing weight. We discussed the progression been very important. Patient will continue to try to increase activity. We'll monitor for any type of flares of the rheumatoid arthritis or any increasing weakness. We discussed different diet changes and over-the-counter medications in great detail. Patient will follow-up again with me more on an as-needed basis as long as patient does well.  Spent  25 minutes with patient face-to-face and had greater than 50% of counseling including as described above in assessment and plan.

## 2016-10-10 NOTE — Patient Instructions (Signed)
great to see you  Ice is your friend Continue the vitamins  Continue the pennsaid Start increasing activity as tolerated and 50% weight increasing 5% every 1-2 weeks.  See me when you need me

## 2016-10-17 DIAGNOSIS — Z720 Tobacco use: Secondary | ICD-10-CM | POA: Diagnosis not present

## 2016-12-19 ENCOUNTER — Telehealth: Payer: Self-pay | Admitting: Family Medicine

## 2016-12-19 DIAGNOSIS — D235 Other benign neoplasm of skin of trunk: Secondary | ICD-10-CM | POA: Diagnosis not present

## 2016-12-19 DIAGNOSIS — L578 Other skin changes due to chronic exposure to nonionizing radiation: Secondary | ICD-10-CM | POA: Diagnosis not present

## 2016-12-19 DIAGNOSIS — L2084 Intrinsic (allergic) eczema: Secondary | ICD-10-CM | POA: Diagnosis not present

## 2016-12-19 NOTE — Telephone Encounter (Signed)
I added all 3 supplements to current medication list.

## 2016-12-19 NOTE — Telephone Encounter (Signed)
Pt would like his med list to include and be updated with the following added:  Saw palmetto complex  320 mg Tumeric  1000 mg Vitamin d  1000 iu  Pt seeing dermatologist today and wanted his med list updated so they can request.

## 2016-12-22 DIAGNOSIS — L538 Other specified erythematous conditions: Secondary | ICD-10-CM | POA: Diagnosis not present

## 2016-12-22 DIAGNOSIS — D235 Other benign neoplasm of skin of trunk: Secondary | ICD-10-CM | POA: Diagnosis not present

## 2017-10-23 ENCOUNTER — Telehealth: Payer: Self-pay | Admitting: Family Medicine

## 2017-10-23 ENCOUNTER — Other Ambulatory Visit: Payer: Self-pay | Admitting: Family Medicine

## 2017-10-23 ENCOUNTER — Other Ambulatory Visit: Payer: Self-pay

## 2017-10-23 MED ORDER — DICLOFENAC SODIUM 2 % TD SOLN: TRANSDERMAL | 3 refills | Status: DC

## 2017-10-23 NOTE — Telephone Encounter (Signed)
Copied from [place]. Topic: Quick Communication - Rx Refill/Question >> Oct 23, 2017  1:04 PM Scherrie Gerlach wrote: Medication: Diclofenac Sodium (PENNSAID) 2 % SOLN Pt would like 3 refills or a year supply  Has the patient contacted their pharmacy? {yes was told this Rx was expired  This was prescribed by Dr Arnetha Gula [facility], [place], [place] - [address] [phone] (Phone) [phone] (Fax)

## 2017-10-24 NOTE — Telephone Encounter (Signed)
Contacted the pt.  Advised that Dr. Hulan Saas has refilled the Diclofenac Sodium 2 % Soln.; advised he wrote the Rx 2/26 and gave 3 refills, but that the pt. needs to schedule an office appt., before any further Rx will be written for this pt.  Verb. Understanding.  Stated he rec'd. a phone call from [facility] this morning and "the transaction has been completed."  Advised the pt. the Rx was sent to [facility], [place], [place].  The pt. does not think the Rx went to the [facility].  Reported that he isn't sure if he spoke with [place].  Stated he will call [facility] and inquire if the Rx went there.

## 2017-10-24 NOTE — Telephone Encounter (Signed)
Patient calling back. Wanted to let nurse that order is handled through [facility], then it is sent to local pharmacy, [place] and then is shipped to patient. Everything has been processed.

## 2018-01-01 DIAGNOSIS — H02831 Dermatochalasis of right upper eyelid: Secondary | ICD-10-CM | POA: Diagnosis not present

## 2018-01-01 DIAGNOSIS — H26492 Other secondary cataract, left eye: Secondary | ICD-10-CM | POA: Diagnosis not present

## 2018-01-01 DIAGNOSIS — H1859 Other hereditary corneal dystrophies: Secondary | ICD-10-CM | POA: Diagnosis not present

## 2018-01-01 DIAGNOSIS — H18413 Arcus senilis, bilateral: Secondary | ICD-10-CM | POA: Diagnosis not present

## 2019-03-24 DIAGNOSIS — H1851 Endothelial corneal dystrophy: Secondary | ICD-10-CM | POA: Diagnosis not present

## 2019-03-24 DIAGNOSIS — H04122 Dry eye syndrome of left lacrimal gland: Secondary | ICD-10-CM | POA: Diagnosis not present

## 2019-03-28 ENCOUNTER — Other Ambulatory Visit: Payer: Self-pay

## 2019-03-28 ENCOUNTER — Ambulatory Visit: Payer: BLUE CROSS/BLUE SHIELD | Admitting: Family Medicine

## 2019-03-28 ENCOUNTER — Encounter: Payer: Self-pay | Admitting: Family Medicine

## 2019-03-28 VITALS — BP 122/72 | HR 80 | Ht 70.0 in | Wt 198.0 lb

## 2019-03-28 DIAGNOSIS — M25561 Pain in right knee: Secondary | ICD-10-CM | POA: Diagnosis not present

## 2019-03-28 DIAGNOSIS — G8929 Other chronic pain: Secondary | ICD-10-CM

## 2019-03-28 DIAGNOSIS — S83241A Other tear of medial meniscus, current injury, right knee, initial encounter: Secondary | ICD-10-CM

## 2019-03-28 DIAGNOSIS — S83206A Unspecified tear of unspecified meniscus, current injury, right knee, initial encounter: Secondary | ICD-10-CM | POA: Insufficient documentation

## 2019-03-28 MED ORDER — DICLOFENAC SODIUM 2 % TD SOLN: 2.0000 g | Freq: Two times a day (BID) | TRANSDERMAL | 3 refills | Status: DC

## 2019-03-28 NOTE — Progress Notes (Signed)
[name] [place], [place] [address] Phone: [phone] Subjective:   James [name], am serving as a scribe for Dr. Hulan Saas.  I'm seeing this patient by the request  of:  Laurey Morale, MD   CC: Right knee pain  [id number]  James [name] is a 68 y.o. male coming in with complaint of right knee pain. Patient states that he knelt on a folded bathmat one month ago. Shifted his weight and felt pain over front of knee. Pain is constant. Is taking chondroitin for arthritis. States that he was diagnosed with arthritis in the 60's. Is using Pennsaid, ice, and bracing.       Past Medical History:  Diagnosis Date  . Anterior basement membrane dystrophy    sees Dr. Vevelyn Royals   . Asthma    as a child, resolved   . Cataract   . 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 Rheumatoid arthritis ([place])   . 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 CATARACT EXTRACTION, BILATERAL     per Dr. Tommy Rainwater   . sqamous cell     right eyebrow excised   . SUPERFICIAL KERATECTOMY Right 2015   per Dr. Lucita Ferrara    Social History   Socioeconomic History  . Marital status: Married    Spouse name: Not on file  . Number of children: Not on file  . Years of education: Not on file  . Highest education level: Not on file  Occupational History  . Not on file  Social Needs  . Financial resource strain: Not on file  . Food insecurity    Worry: Not on file    Inability: Not on file  . Transportation needs    Medical: Not on file    Non-medical: Not on file  Tobacco Use  . Smoking status: Current Every Day Smoker    Packs/day: 1.00    Types: Cigarettes  . Smokeless tobacco: Never Used  Substance and Sexual Activity  . Alcohol use: Yes    Alcohol/week: 0.0 standard drinks    Comment: rare  . Drug use: [name]  . Sexual  activity: Not on file  Lifestyle  . Physical activity    Days per week: Not on file    Minutes per session: Not on file  . Stress: Not on file  Relationships  . Social Herbalist on phone: Not on file    Gets together: Not on file    Attends religious service: Not on file    Active member of club or organization: Not on file    Attends meetings of clubs or organizations: Not on file    Relationship status: Not on file  Other Topics Concern  . Not on file  Social History Narrative  . Not on file   Allergies  Allergen Reactions  . Penicillins     Respiratory reaction SOB   Family History  Problem Relation Age of Onset  . Uterine cancer Mother   . Breast cancer Mother       Current Outpatient Medications (Respiratory):  Marland Kitchen  Albuterol Sulfate 108 (90 BASE) MCG/ACT AEPB, Inhale into the lungs as needed.    Current Outpatient Medications (Other):  .  cholecalciferol (VITAMIN D) 1000 units tablet, Take 1,000 Units by mouth  daily. .  Diclofenac Sodium (PENNSAID) 2 % SOLN, Apply 1 pump twice daily. .  Saw Palmetto, Serenoa repens, (SAW PALMETTO PO), Take 320 mg by mouth daily. .  TURMERIC PO, Take 1,000 mg by mouth daily. .  Diclofenac Sodium 2 % SOLN, Place 2 g onto the skin 2 (two) times daily.    Past medical history, social, surgical and family history all reviewed in electronic medical record.  [name] pertanent information unless stated regarding to the chief complaint.   Review of Systems:  [name] headache, visual changes, nausea, vomiting, diarrhea, constipation, dizziness, abdominal pain, skin rash, fevers, chills, night sweats, weight loss, swollen lymph nodes, body aches, joint swelling, muscle aches, chest pain, shortness of breath, mood changes.   Objective  Blood pressure 122/72, pulse 80, height 5\' 10"  (1.778 m), weight 198 lb (89.8 kg), SpO2 97 %.    General: [name] apparent distress alert and oriented x3 mood and affect normal, dressed appropriately.   HEENT: Pupils equal, extraocular movements intact  Respiratory: Patient's speak in full sentences and does not appear short of breath  Cardiovascular: [name] lower extremity edema, non tender, [name] erythema  Skin: Warm dry intact with [name] signs of infection or rash on extremities or on axial skeleton.  Abdomen: Soft nontender  Neuro: Cranial nerves II through XII are intact, neurovascularly intact in all extremities with 2+ DTRs and 2+ pulses.  Lymph: [name] lymphadenopathy of posterior or anterior cervical chain or axillae bilaterally.  Gait antalgic MSK:  tender with limited range of motion and good stability and symmetric strength and tone of shoulders, , wrist, hip, and ankles bilaterally.   Right knee exam shows the patient does have tenderness to palpation over the medial joint line.  Positive McMurray's and Thessaly's test noted today.  Patient does have mild crepitus.  Moderate arthritic changes with mild abnormal thigh to calf ratio.  Crepitus noted with mild positive patella grind test.  Neurovascular intact distally.   Impression and Recommendations:     This case required medical decision making of moderate complexity. The above documentation has been reviewed and is accurate and complete Lyndal Pulley, DO       Note: This dictation was prepared with Dragon dictation along with smaller phrase technology. Any transcriptional errors that result from this process are unintentional.

## 2019-03-28 NOTE — Assessment & Plan Note (Signed)
Patient likely has what appears to be a degenerative meniscal tear with a positive McMurray's and Thessaly's test today.  Patient though is able to ambulate fine.  We discussed with patient about icing regimen, home exercises, topical anti-inflammatories.  X-rays ordered and will be reviewed for osteoarthritic changes.  Patient continues to have trouble we will consider the possibility of injections, formal physical therapy and then notes continued difficulty imaging.  Follow-up again in 4 to 6 weeks

## 2019-03-28 NOTE — Patient Instructions (Signed)
Pennsaid 2x a day as needed Meniscus Continue Vitamin D Spenco Orthotics Total Support See me again in 4-6 weeks, if not better will consider PT

## 2019-04-01 ENCOUNTER — Ambulatory Visit (INDEPENDENT_AMBULATORY_CARE_PROVIDER_SITE_OTHER)
Admission: RE | Admit: 2019-04-01 | Discharge: 2019-04-01 | Disposition: A | Discharge status: Home / Self Care | Payer: BC Managed Care – PPO | Source: Ambulatory Visit | Attending: Family Medicine | Admitting: Family Medicine

## 2019-04-01 ENCOUNTER — Other Ambulatory Visit: Payer: Self-pay

## 2019-04-01 DIAGNOSIS — M25561 Pain in right knee: Secondary | ICD-10-CM | POA: Diagnosis not present

## 2019-04-01 DIAGNOSIS — G8929 Other chronic pain: Secondary | ICD-10-CM | POA: Diagnosis not present

## 2019-04-01 IMAGING — DX RIGHT KNEE - COMPLETE 4+ VIEW
4 series · 4 of 4 positions shown · non-contrast
Comparison: None.

CLINICAL DATA: Right knee pain.

EXAM:
RIGHT KNEE - COMPLETE 4+ VIEW

[knee ap]
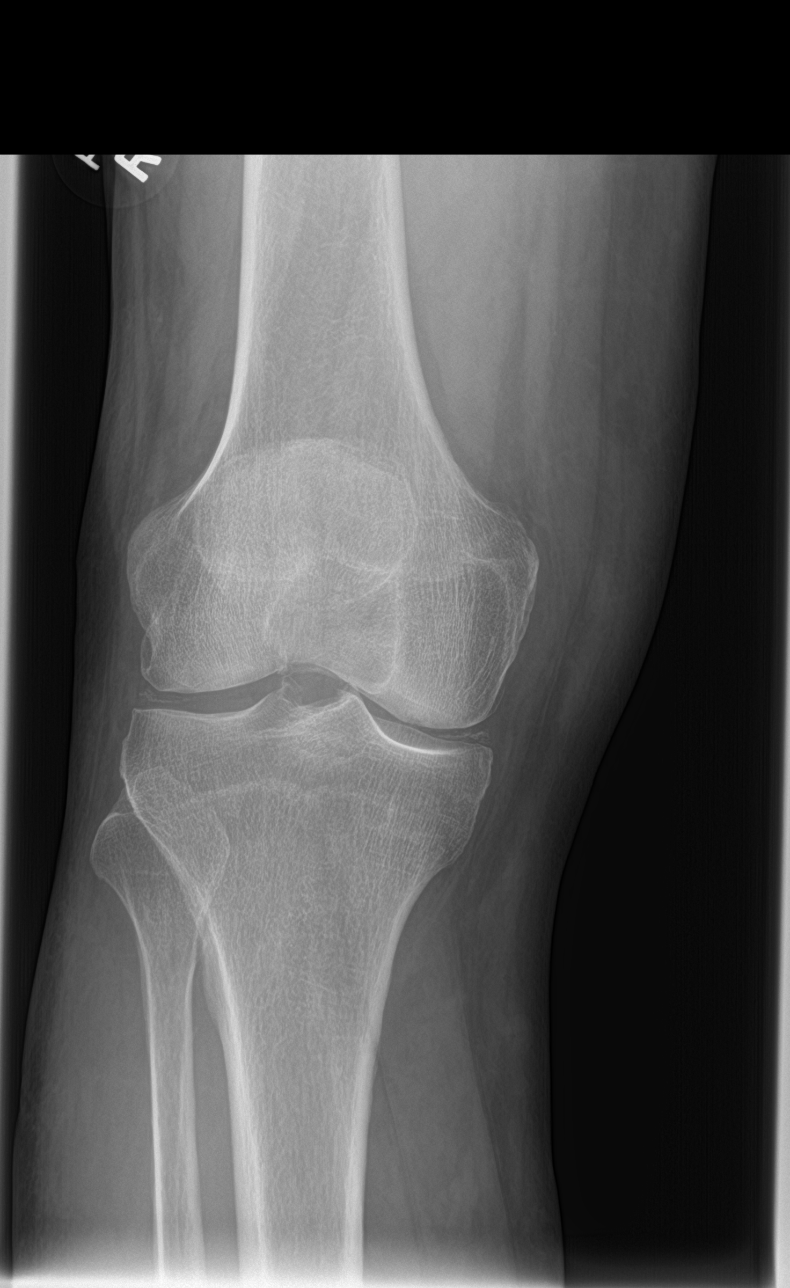

[knee tunnel]
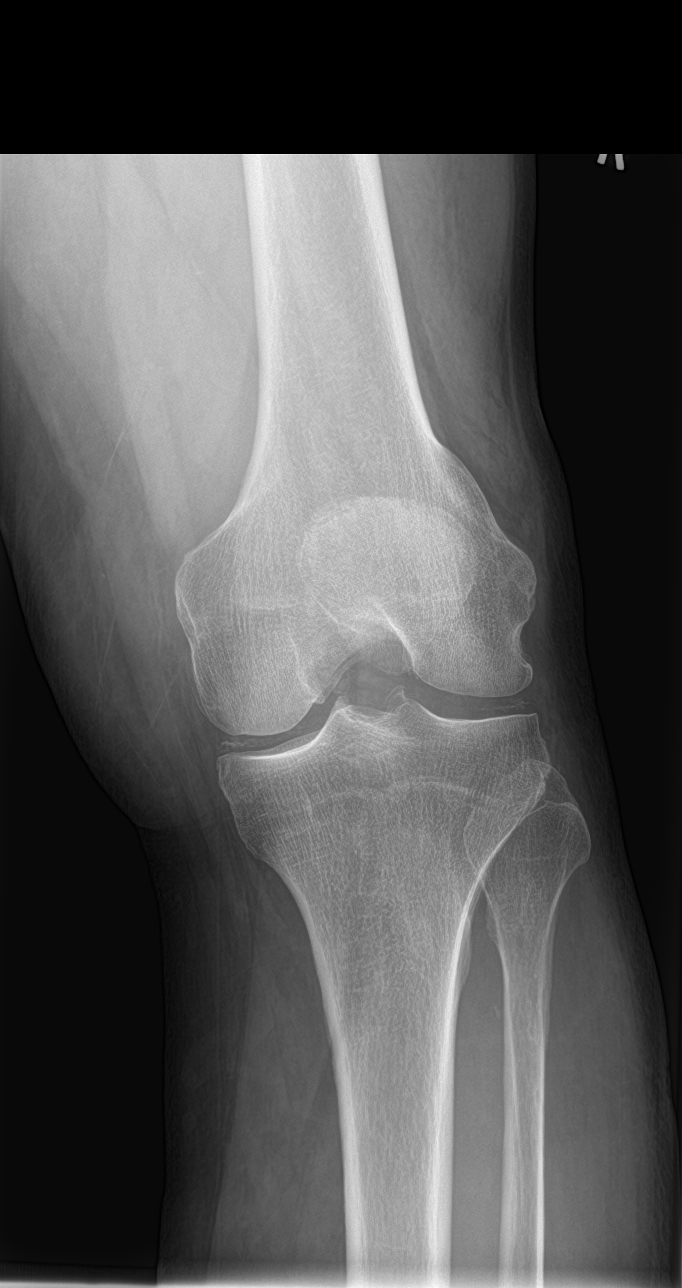

[knee lat]
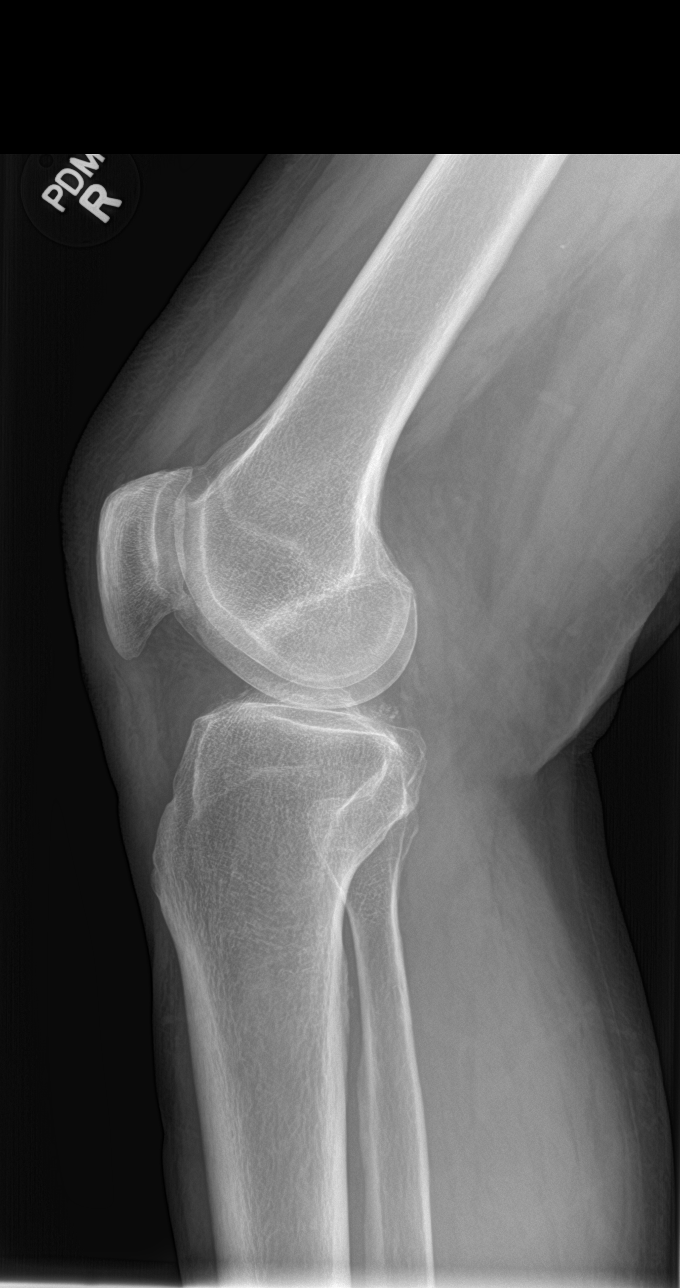

[sunrise]
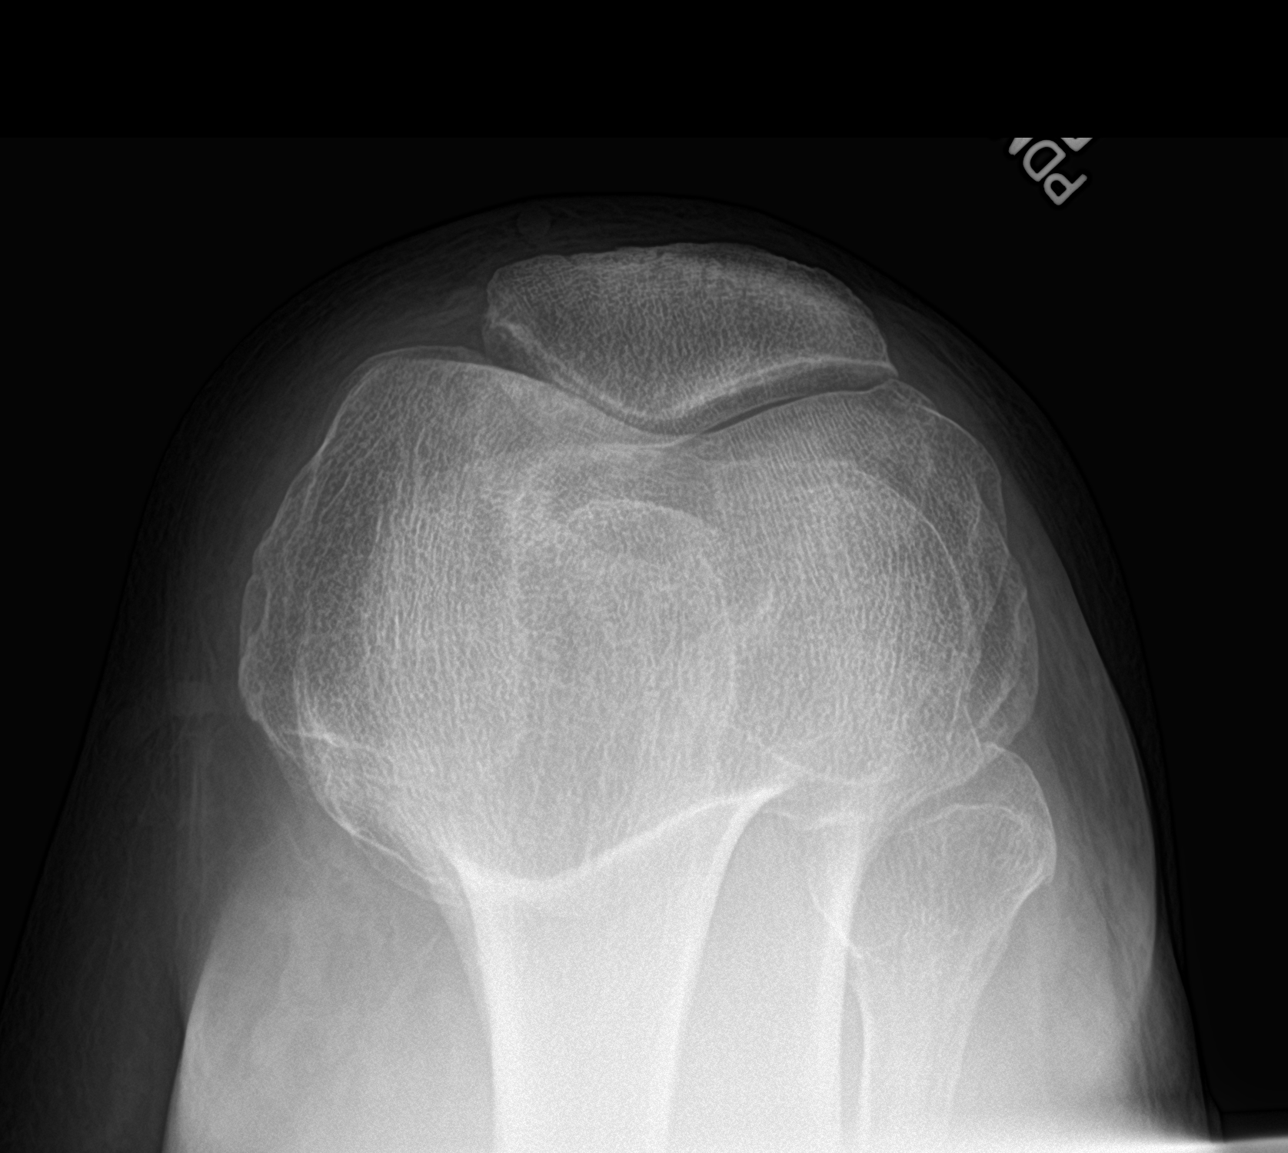

[4 of 4 positions shown; findings below may reference images not displayed]

FINDINGS: There is no displaced fracture. No dislocation. No significant joint
effusion. Chondrocalcinosis and mild multicompartmental degenerative
changes are noted. The osseous mineralization is within normal
limits.
IMPRESSION: 1. No acute osseous abnormality.
2. Mild multicompartmental degenerative changes.
3. Chondrocalcinosis.

## 2019-04-07 ENCOUNTER — Telehealth: Payer: Self-pay

## 2019-04-07 NOTE — Telephone Encounter (Signed)
Spoke with patient that Dr. Tamala Julian is out of the office this week. Will call patient back once results are available.

## 2019-04-17 ENCOUNTER — Telehealth: Payer: Self-pay | Admitting: *Deleted

## 2019-04-17 NOTE — Telephone Encounter (Signed)
Pt called requesting x-ray results & a diagnosis of his knee.

## 2019-04-21 NOTE — Telephone Encounter (Signed)
Moderate arthritis should do well

## 2019-05-01 ENCOUNTER — Ambulatory Visit (INDEPENDENT_AMBULATORY_CARE_PROVIDER_SITE_OTHER): Payer: BC Managed Care – PPO | Admitting: Family Medicine

## 2019-05-01 ENCOUNTER — Encounter: Payer: Self-pay | Admitting: Family Medicine

## 2019-05-01 DIAGNOSIS — S83241D Other tear of medial meniscus, current injury, right knee, subsequent encounter: Secondary | ICD-10-CM

## 2019-05-01 NOTE — Assessment & Plan Note (Signed)
Meniscal tear seems to be healing appropriately.  Discussed with patient about home exercises and icing regimen, topical anti-inflammatories.  I believe patient will do relatively well.  Follow-up again in 4 to 6 weeks if pain is not completely resolved

## 2019-05-01 NOTE — Patient Instructions (Signed)
Tart cherry 1200 mg at night See me again in 6-7 weeks

## 2019-05-01 NOTE — Progress Notes (Signed)
[name] [name], [place] [address] Phone: [phone] Subjective:      CC: Knee pain follow-up  [id number]   03/28/2019   Patient likely has what appears to be a degenerative meniscal tear with a positive McMurray's and Thessaly's test today.  Patient though is able to ambulate fine.  We discussed with patient about icing regimen, home exercises, topical anti-inflammatories.  X-rays ordered and will be reviewed for osteoarthritic changes.  Patient continues to have trouble we will consider the possibility of injections, formal physical therapy and then notes continued difficulty imaging.  Follow-up again in 4 to 6 weeks  Update 05/01/2019    James [name] is a 68 y.o. male coming in with complaint of right knee pain. Pain occurring over the medial aspect with some pain in lateral hamstring. Pain occurs with walking on uneven surfaces. Gradual improvement. Does have soreness with walking on uneven surfaces.  Patient does state that he is probably approximately 75 to 85% better.  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  Past Medical History:  Diagnosis Date  . Anterior basement membrane dystrophy    sees Dr. Vevelyn [name]   . Asthma    as a child, resolved   . Cataract   . 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 Rheumatoid arthritis ([place])   . 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 CATARACT EXTRACTION, BILATERAL     per Dr. Tommy [name]   . sqamous cell     right eyebrow excised   . SUPERFICIAL KERATECTOMY Right 2015   per Dr. Lucita [name]    Social History   Socioeconomic History  . Marital status: Married    Spouse name: Not on file  . Number of children: Not on file  . Years of education: Not on file  . Highest education level: Not on file   Occupational History  . Not on file  Social Needs  . Financial resource strain: Not on file  . Food insecurity    Worry: Not on file    Inability: Not on file  . Transportation needs    Medical: Not on file    Non-medical: Not on file  Tobacco Use  . Smoking status: Current Every Day Smoker    Packs/day: 1.00    Types: Cigarettes  . Smokeless tobacco: Never Used  Substance and Sexual Activity  . Alcohol use: Yes    Alcohol/week: 0.0 standard drinks    Comment: rare  . Drug use: No  . Sexual activity: Not on file  Lifestyle  . Physical activity    Days per week: Not on file    Minutes per session: Not on file  . Stress: Not on file  Relationships  . Social Herbalist on phone: Not on file    Gets together: Not on file    Attends religious service: Not on file    Active member of club or organization: Not on file    Attends meetings of clubs or organizations: Not on file    Relationship status: Not on file  Other Topics Concern  . Not on file  Social History Narrative  . Not on file   Allergies  Allergen Reactions  . Penicillins  Respiratory reaction SOB   Family History  Problem Relation Age of Onset  . Uterine cancer Mother   . Breast cancer Mother       Current Outpatient Medications (Respiratory):  Marland Kitchen  Albuterol Sulfate 108 (90 BASE) MCG/ACT AEPB, Inhale into the lungs as needed.    Current Outpatient Medications (Other):  .  cholecalciferol (VITAMIN D) 1000 units tablet, Take 1,000 Units by mouth daily. .  Diclofenac Sodium (PENNSAID) 2 % SOLN, Apply 1 pump twice daily. .  Diclofenac Sodium 2 % SOLN, Place 2 g onto the skin 2 (two) times daily. .  Saw Palmetto, Serenoa repens, (SAW PALMETTO PO), Take 320 mg by mouth daily. .  TURMERIC PO, Take 1,000 mg by mouth daily.    Past medical history, social, surgical and family history all reviewed in electronic medical record.  No pertanent information unless stated regarding to the chief  complaint.   Review of Systems:  No headache, visual changes, nausea, vomiting, diarrhea, constipation, dizziness, abdominal pain, skin rash, fevers, chills, night sweats, weight loss, swollen lymph nodes, body aches, joint swelling,  chest pain, shortness of breath, mood changes.  Mild positive muscle aches  Objective  Blood pressure 122/72, pulse 94, SpO2 98 %.    General: No apparent distress alert and oriented x3 mood and affect normal, dressed appropriately.  HEENT: Pupils equal, extraocular movements intact  Respiratory: Patient's speak in full sentences and does not appear short of breath  Cardiovascular: No lower extremity edema, non tender, no erythema  Skin: Warm dry intact with no signs of infection or rash on extremities or on axial skeleton.  Abdomen: Soft nontender  Neuro: Cranial nerves II through XII are intact, neurovascularly intact in all extremities with 2+ DTRs and 2+ pulses.  Lymph: No lymphadenopathy of posterior or anterior cervical chain or axillae bilaterally.  Gait normal with good balance and coordination.  MSK:  Non tender with full range of motion and good stability and symmetric strength and tone of shoulders, elbows, wrist, hip, and ankles bilaterally.  Knee: Right Normal to inspection with no erythema or effusion or obvious bony abnormalities. Palpatio mild tenderness noted over the medial joint space ROM full in flexion and extension and lower leg rotation. Ligaments with solid consistent endpoints including ACL, PCL, LCL, MCL. Negative Mcmurray's, Apley's, and Thessalonian tests. Non painful patellar compression. Patellar glide without crepitus. Patellar and quadriceps tendons unremarkable. Hamstring and quadriceps strength is normal.   Impression and Recommendations:      The above documentation has been reviewed and is accurate and complete James Pulley, DO       Note: This dictation was prepared with Dragon dictation along with smaller  phrase technology. Any transcriptional errors that result from this process are unintentional.

## 2019-06-12 ENCOUNTER — Ambulatory Visit: Payer: BC Managed Care – PPO | Admitting: Family Medicine

## 2019-06-19 ENCOUNTER — Encounter: Payer: Self-pay | Admitting: Family Medicine

## 2019-06-19 ENCOUNTER — Other Ambulatory Visit: Payer: Self-pay

## 2019-06-19 ENCOUNTER — Ambulatory Visit: Payer: BC Managed Care – PPO | Admitting: Family Medicine

## 2019-06-19 DIAGNOSIS — S83241D Other tear of medial meniscus, current injury, right knee, subsequent encounter: Secondary | ICD-10-CM

## 2019-06-19 NOTE — Assessment & Plan Note (Signed)
Patient is doing remarkably well at this time.  No significant change in management.  Patient declines continues to work well to see me as needed otherwise and follow-up as needed

## 2019-06-19 NOTE — Progress Notes (Signed)
[name] [address] [place], [place] [address] Phone: [phone] Subjective:   James [name], am serving as a scribe for Dr. Hulan Saas.  I'm seeing this patient by the request  of:    CC: Right knee pain follow-up  [id number]    05/01/19: Meniscal tear seems to be healing appropriately.  Discussed with patient about home exercises and icing regimen, topical anti-inflammatories.  I believe patient will do relatively well.  Follow-up again in 4 to 6 weeks if pain is not completely resolved  Update- 06/19/19 James [name] is a 68 y.o. male coming in with complaint of R knee pain. Patient is doing well. Did have one incident when walking uphill at a job caused an increase in his pain. Pain has improved since then. Is having some pain over the tibial tuberosity. Painful with standing.       Past Medical History:  Diagnosis Date  . Anterior basement membrane dystrophy    sees Dr. Vevelyn Royals   . Asthma    as a child, resolved   . Cataract   . 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 Rheumatoid arthritis ([place])   . 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 CATARACT EXTRACTION, BILATERAL     per Dr. Tommy Rainwater   . sqamous cell     right eyebrow excised   . SUPERFICIAL KERATECTOMY Right 2015   per Dr. Lucita Ferrara    Social History   Socioeconomic History  . Marital status: Married    Spouse name: Not on file  . Number of children: Not on file  . Years of education: Not on file  . Highest education level: Not on file  Occupational History  . Not on file  Social Needs  . Financial resource strain: Not on file  . Food insecurity    Worry: Not on file    Inability: Not on file  . Transportation needs    Medical: Not on file    Non-medical: Not on file  Tobacco Use  . Smoking status: Current Every Day Smoker     Packs/day: 1.00    Types: Cigarettes  . Smokeless tobacco: Never Used  Substance and Sexual Activity  . Alcohol use: Yes    Alcohol/week: 0.0 standard drinks    Comment: rare  . Drug use: [name]  . Sexual activity: Not on file  Lifestyle  . Physical activity    Days per week: Not on file    Minutes per session: Not on file  . Stress: Not on file  Relationships  . Social Herbalist on phone: Not on file    Gets together: Not on file    Attends religious service: Not on file    Active member of club or organization: Not on file    Attends meetings of clubs or organizations: Not on file    Relationship status: Not on file  Other Topics Concern  . Not on file  Social History Narrative  . Not on file   Allergies  Allergen Reactions  . Penicillins     Respiratory reaction SOB   Family History  Problem Relation Age of Onset  . Uterine cancer Mother   . Breast cancer Mother       Current Outpatient Medications (Respiratory):  .  Albuterol Sulfate 108 (90 BASE) MCG/ACT AEPB, Inhale into the lungs as needed.    Current Outpatient Medications (Other):  .  cholecalciferol (VITAMIN D) 1000 units tablet, Take 1,000 Units by mouth daily. .  Diclofenac Sodium (PENNSAID) 2 % SOLN, Apply 1 pump twice daily. .  Diclofenac Sodium 2 % SOLN, Place 2 g onto the skin 2 (two) times daily. .  Saw Palmetto, Serenoa repens, (SAW PALMETTO PO), Take 320 mg by mouth daily. .  TURMERIC PO, Take 1,000 mg by mouth daily.    Past medical history, social, surgical and family history all reviewed in electronic medical record.  [name] pertanent information unless stated regarding to the chief complaint.   Review of Systems:  [name] headache, visual changes, nausea, vomiting, diarrhea, constipation, dizziness, abdominal pain, skin rash, fevers, chills, night sweats, weight loss, swollen lymph nodes, body aches, joint swelling, muscle aches, chest pain, shortness of breath, mood changes.    Objective  Blood pressure 110/76, pulse 75, height 5\' 10"  (1.778 m), weight 200 lb (90.7 kg), SpO2 98 %.    General: [name] apparent distress alert and oriented x3 mood and affect normal, dressed appropriately.  HEENT: Pupils equal, extraocular movements intact  Respiratory: Patient's speak in full sentences and does not appear short of breath  Cardiovascular: [name] lower extremity edema, non tender, [name] erythema  Skin: Warm dry intact with [name] signs of infection or rash on extremities or on axial skeleton.  Abdomen: Soft nontender  Neuro: Cranial nerves II through XII are intact, neurovascularly intact in all extremities with 2+ DTRs and 2+ pulses.  Lymph: [name] lymphadenopathy of posterior or anterior cervical chain or axillae bilaterally.  Gait normal with good balance and coordination.  MSK:  Non tender with full range of motion and good stability and symmetric strength and tone of shoulders, elbows, wrist, hip and ankles bilaterally.  Right knee exam shows the patient does have some minimal tenderness over the medial joint line as well as the tibial tuberosity but full range of motion.  Negative McMurray's.  Mild positive patellar grind test.  Good stability noted.   Impression and Recommendations:      The above documentation has been reviewed and is accurate and complete Lyndal Pulley, DO       Note: This dictation was prepared with Dragon dictation along with smaller phrase technology. Any transcriptional errors that result from this process are unintentional.

## 2019-06-19 NOTE — Patient Instructions (Signed)
Should perfect by [place] Keep everything up Make appt for 8 weeks otherwise see me when you need me

## 2019-06-26 ENCOUNTER — Telehealth: Payer: Self-pay

## 2019-06-26 NOTE — Telephone Encounter (Signed)
Please set up a TOC visit

## 2019-06-26 NOTE — Telephone Encounter (Signed)
Copied from [place] [id number]. Topic: General - Other >> Jun 20, 2019  3:21 PM Celene Kras A wrote: Reason for CRM: Pt called stating he is wanting to establish care with Dr. Sarajane Jews. Pt states his wife is a pt of Dr. Barbie Banner. Please advise .

## 2019-06-27 NOTE — Telephone Encounter (Signed)
Patient notified of update  and verbalized understanding. 

## 2019-07-11 ENCOUNTER — Ambulatory Visit: Payer: BC Managed Care – PPO | Admitting: Family Medicine

## 2019-07-17 ENCOUNTER — Other Ambulatory Visit: Payer: Self-pay

## 2019-07-18 ENCOUNTER — Encounter: Payer: Self-pay | Admitting: Family Medicine

## 2019-07-18 ENCOUNTER — Ambulatory Visit: Payer: BC Managed Care – PPO | Admitting: Family Medicine

## 2019-07-18 VITALS — BP 140/68 | HR 84 | Temp 98.6°F | Ht 70.0 in | Wt 199.0 lb

## 2019-07-18 DIAGNOSIS — J449 Chronic obstructive pulmonary disease, unspecified: Secondary | ICD-10-CM | POA: Diagnosis not present

## 2019-07-18 DIAGNOSIS — Z0000 Encounter for general adult medical examination without abnormal findings: Secondary | ICD-10-CM

## 2019-07-18 DIAGNOSIS — M2550 Pain in unspecified joint: Secondary | ICD-10-CM | POA: Diagnosis not present

## 2019-07-18 MED ORDER — ALBUTEROL SULFATE 108 (90 BASE) MCG/ACT IN AEPB: 2.0000 | INHALATION_SPRAY | RESPIRATORY_TRACT | 11 refills | Status: DC | PRN

## 2019-07-18 NOTE — Progress Notes (Signed)
Subjective:    Patient ID: James [name], male    DOB: [birth year], 68 y.o.   MRN: [id number]  HPI Here to re-establish after a 5 year absence and for a well exam. His only complaint today is frequent non-productive coughing and mild SOB. He has been smoking for over 50 years. He uses his albuterol inhaler once or twice a month. He has some joint pains which he manages with cherry extract and OTC supplements. He tries to avoid taking prescriptions if he can.    Review of Systems  Constitutional: Negative.   HENT: Negative.   Eyes: Negative.   Respiratory: Positive for cough, shortness of breath and wheezing.   Cardiovascular: Negative.   Gastrointestinal: Negative.   Genitourinary: Negative.   Musculoskeletal: Positive for arthralgias.  Skin: Negative.   Neurological: Negative.   Psychiatric/Behavioral: Negative.        Objective:   Physical Exam Constitutional:      General: He is not in acute distress.    Appearance: He is well-developed. He is not diaphoretic.  HENT:     Head: Normocephalic and atraumatic.     Right Ear: External ear normal.     Left Ear: External ear normal.     Nose: Nose normal.     Mouth/Throat:     Pharynx: No oropharyngeal exudate.  Eyes:     General: No scleral icterus.       Right eye: No discharge.        Left eye: No discharge.     Conjunctiva/sclera: Conjunctivae normal.     Pupils: Pupils are equal, round, and reactive to light.  Neck:     Musculoskeletal: Neck supple.     Thyroid: No thyromegaly.     Vascular: No JVD.     Trachea: No tracheal deviation.  Cardiovascular:     Rate and Rhythm: Normal rate and regular rhythm.     Heart sounds: Normal heart sounds. No murmur. No friction rub. No gallop.   Pulmonary:     Effort: Pulmonary effort is normal. No respiratory distress.     Breath sounds: No rales.     Comments: Loud scattered wheezes and rhonchi throughout both lungs  Chest:     Chest wall: No tenderness.  Abdominal:      General: Bowel sounds are normal. There is no distension.     Palpations: Abdomen is soft. There is no mass.     Tenderness: There is no abdominal tenderness. There is no guarding or rebound.  Genitourinary:    Penis: Normal. No tenderness.      Scrotum/Testes: Normal.     Prostate: Normal.     Rectum: Normal. Guaiac result negative.  Musculoskeletal: Normal range of motion.        General: No tenderness.  Lymphadenopathy:     Cervical: No cervical adenopathy.  Skin:    General: Skin is warm and dry.     Coloration: Skin is not pale.     Findings: No erythema or rash.  Neurological:     Mental Status: He is alert and oriented to person, place, and time.     Cranial Nerves: No cranial nerve deficit.     Motor: No abnormal muscle tone.     Coordination: Coordination normal.     Deep Tendon Reflexes: Reflexes are normal and symmetric. Reflexes normal.  Psychiatric:        Behavior: Behavior normal.        Thought Content: Thought content normal.  Judgment: Judgment normal.           Assessment & Plan:  Well exam. We discussed diet and exercise. Set up fasting labs for next week. He has COPD and we will get a CXR next week. He can use the inhaler prn. I strongly urged him to quit smoking and offered some help aids like nicotine patches or Chantix, but he declined these. We will set up a colonoscopy.  Alysia Penna, MD

## 2019-07-21 ENCOUNTER — Other Ambulatory Visit: Payer: Self-pay

## 2019-07-22 ENCOUNTER — Ambulatory Visit (INDEPENDENT_AMBULATORY_CARE_PROVIDER_SITE_OTHER): Payer: BC Managed Care – PPO

## 2019-07-22 ENCOUNTER — Other Ambulatory Visit (INDEPENDENT_AMBULATORY_CARE_PROVIDER_SITE_OTHER): Payer: BC Managed Care – PPO

## 2019-07-22 DIAGNOSIS — M2550 Pain in unspecified joint: Secondary | ICD-10-CM

## 2019-07-22 DIAGNOSIS — Z0000 Encounter for general adult medical examination without abnormal findings: Secondary | ICD-10-CM | POA: Diagnosis not present

## 2019-07-22 DIAGNOSIS — J449 Chronic obstructive pulmonary disease, unspecified: Secondary | ICD-10-CM

## 2019-07-22 LAB — CBC WITH DIFFERENTIAL/PLATELET
Basophils Absolute: 0.1 10*3/uL (ref 0.0–0.1)
Basophils Relative: 0.7 % (ref 0.0–3.0)
Eosinophils Absolute: 0.1 10*3/uL (ref 0.0–0.7)
Eosinophils Relative: 0.8 % (ref 0.0–5.0)
HCT: 43.4 % (ref 39.0–52.0)
Hemoglobin: 14.6 g/dL (ref 13.0–17.0)
Lymphocytes Relative: 24.4 % (ref 12.0–46.0)
Lymphs Abs: 2.3 10*3/uL (ref 0.7–4.0)
MCHC: 33.7 g/dL (ref 30.0–36.0)
MCV: 99.5 fl (ref 78.0–100.0)
Monocytes Absolute: 0.9 10*3/uL (ref 0.1–1.0)
Monocytes Relative: 9.1 % (ref 3.0–12.0)
Neutro Abs: 6.2 10*3/uL (ref 1.4–7.7)
Neutrophils Relative %: 65 % (ref 43.0–77.0)
Platelets: 225 10*3/uL (ref 150.0–400.0)
RBC: 4.36 Mil/uL (ref 4.22–5.81)
RDW: 12.4 % (ref 11.5–15.5)
WBC: 9.6 10*3/uL (ref 4.0–10.5)

## 2019-07-22 LAB — BASIC METABOLIC PANEL
BUN: 13 mg/dL (ref 6–23)
CO2: 28 mEq/L (ref 19–32)
Calcium: 9.9 mg/dL (ref 8.4–10.5)
Chloride: 104 mEq/L (ref 96–112)
Creatinine, Ser: 0.9 mg/dL (ref 0.40–1.50)
GFR: 83.43 mL/min (ref >60.00)
Glucose, Bld: 119 mg/dL — ABNORMAL HIGH (ref 70–99)
Potassium: 4.7 mEq/L (ref 3.5–5.1)
Sodium: 137 mEq/L (ref 135–145)

## 2019-07-22 LAB — LIPID PANEL
Cholesterol: 201 mg/dL — ABNORMAL HIGH (ref 0–200)
HDL: 40.6 mg/dL (ref >39.00)
LDL Cholesterol: 144 mg/dL — ABNORMAL HIGH (ref 0–99)
NonHDL: 160.86
Total CHOL/HDL Ratio: 5
Triglycerides: 84 mg/dL (ref 0.0–149.0)
VLDL: 16.8 mg/dL (ref 0.0–40.0)

## 2019-07-22 LAB — HEPATIC FUNCTION PANEL
ALT: 28 U/L (ref 0–53)
AST: 27 U/L (ref 0–37)
Albumin: 4.1 g/dL (ref 3.5–5.2)
Alkaline Phosphatase: 120 U/L — ABNORMAL HIGH (ref 39–117)
Bilirubin, Direct: 0.1 mg/dL (ref 0.0–0.3)
Total Bilirubin: 0.9 mg/dL (ref 0.2–1.2)
Total Protein: 6.4 g/dL (ref 6.0–8.3)

## 2019-07-22 LAB — TSH: TSH: 1.29 u[IU]/mL (ref 0.35–4.50)

## 2019-07-22 LAB — PSA: PSA: 1.81 ng/mL (ref 0.10–4.00)

## 2019-07-22 LAB — URIC ACID: Uric Acid, Serum: 5.2 mg/dL (ref 4.0–7.8)

## 2019-07-22 IMAGING — DX DG CHEST 2V
2 series · 2 of 2 positions shown · non-contrast
Comparison: None.

CLINICAL DATA: COPD

EXAM:
CHEST - 2 VIEW

[chest pa]
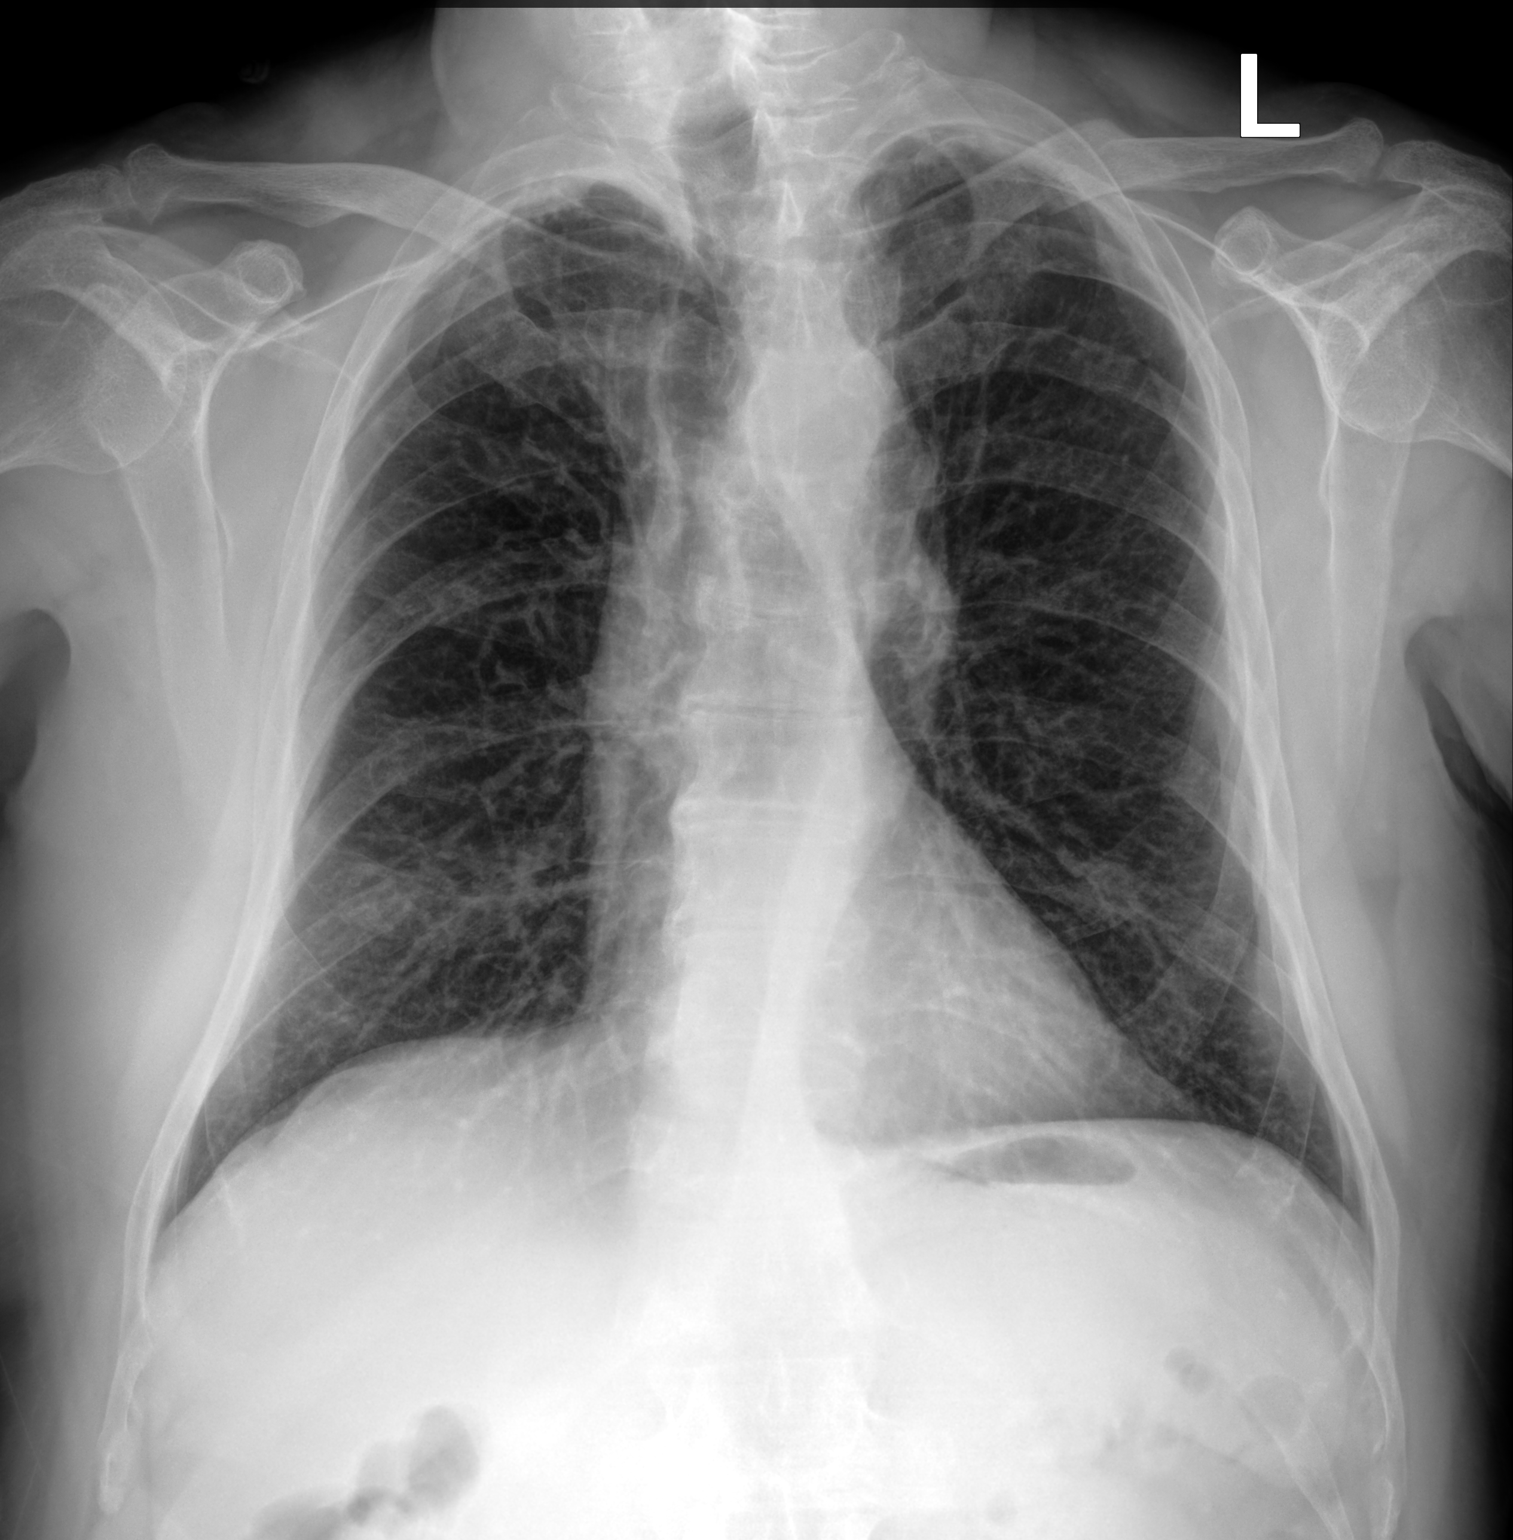

[chest lat]
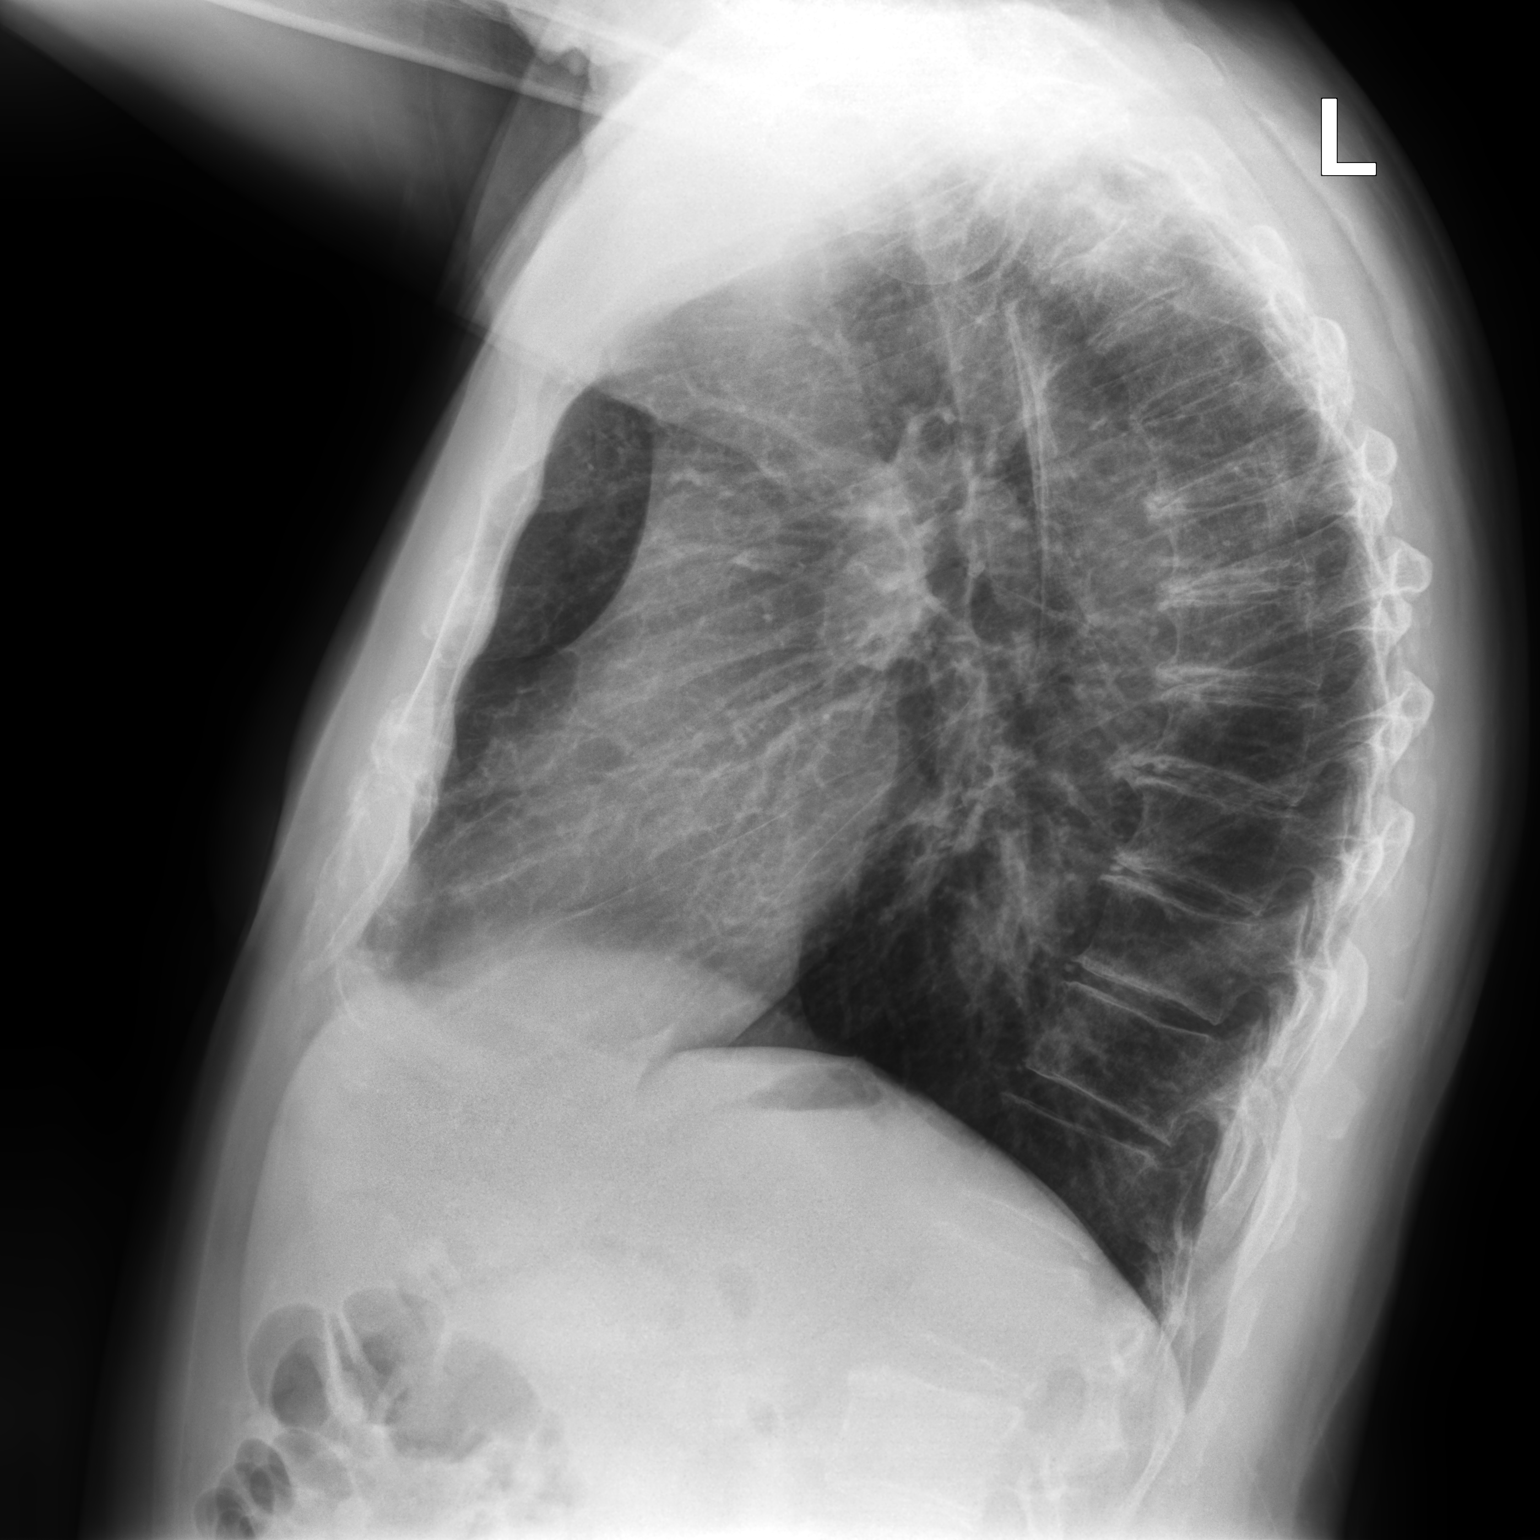

[2 of 2 positions shown; findings below may reference images not displayed]

FINDINGS: The heart size and mediastinal contours are within normal limits.
Probable emphysema. Disc degenerative disease of the thoracic spine.
IMPRESSION: No acute abnormality of the lungs.  Probable emphysema.

## 2019-07-28 ENCOUNTER — Telehealth: Payer: Self-pay

## 2019-07-28 NOTE — Telephone Encounter (Signed)
Pt returned call and requested his xray result. Pt informed of Dr Barbie Banner result note written on 07/22/19. Pt verbalized understanding.

## 2019-07-30 ENCOUNTER — Encounter: Payer: Self-pay | Admitting: Gastroenterology

## 2019-07-30 ENCOUNTER — Ambulatory Visit: Payer: BC Managed Care – PPO | Admitting: Internal Medicine

## 2019-08-13 ENCOUNTER — Ambulatory Visit (INDEPENDENT_AMBULATORY_CARE_PROVIDER_SITE_OTHER): Payer: BC Managed Care – PPO | Admitting: Family Medicine

## 2019-08-13 ENCOUNTER — Encounter: Payer: Self-pay | Admitting: Family Medicine

## 2019-08-13 ENCOUNTER — Other Ambulatory Visit: Payer: Self-pay

## 2019-08-13 DIAGNOSIS — S83241D Other tear of medial meniscus, current injury, right knee, subsequent encounter: Secondary | ICD-10-CM

## 2019-08-13 MED ORDER — PENNSAID 2 % EX SOLN: 2.0000 g | Freq: Two times a day (BID) | CUTANEOUS | 3 refills | Status: DC

## 2019-08-13 NOTE — Progress Notes (Signed)
[name] [address] [place], [place] [address] Phone: [phone] Subjective:   I James [name] am serving as a Education administrator for Dr. Hulan Saas.   This visit occurred during the SARS-CoV-2 public health emergency.  Safety protocols were in place, including screening questions prior to the visit, additional usage of staff PPE, and extensive cleaning of exam room while observing appropriate contact time as indicated for disinfecting solutions.     CC: Right knee pain  [id number]   06/19/2019 Patient is doing remarkably well at this time.  No significant change in management.  Patient declines continues to work well to see me as needed otherwise and follow-up as needed  08/13/2019 James [name] is a 68 y.o. male coming in with complaint of right knee pain. Patient states his mobility is better.Walking or carrying heaving things his knee is painful. Twisting motion is painful.  Patient was seen previously and has been wearing his brace on a regular basis.  Feels like he was making significant improvement now slowly worsening again since the injury.  Patient since then starting to affect daily activities especially going up or down stairs.     Past Medical History:  Diagnosis Date  . Anterior basement membrane dystrophy    sees Dr. Vevelyn Royals   . Asthma    as a child, resolved   . Cataract   . 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 Rheumatoid arthritis ([place])   . 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 CATARACT EXTRACTION, BILATERAL     per Dr. Tommy Rainwater   . sqamous cell     right eyebrow excised   . SUPERFICIAL KERATECTOMY Right 2015   per Dr. Lucita Ferrara    Social History   Socioeconomic History  . Marital status: Married    Spouse name: Not on file  . Number of children: Not on file  . Years of education: Not on  file  . Highest education level: Not on file  Occupational History  . Not on file  Tobacco Use  . Smoking status: Current Every Day Smoker    Packs/day: 1.00    Types: Cigarettes  . Smokeless tobacco: Never Used  Substance and Sexual Activity  . Alcohol use: Yes    Alcohol/week: 0.0 standard drinks    Comment: rare  . Drug use: No  . Sexual activity: Not on file  Other Topics Concern  . Not on file  Social History Narrative  . Not on file   Social Determinants of Health   Financial Resource Strain:   . Difficulty of Paying Living Expenses: Not on file  Food Insecurity:   . Worried About Charity fundraiser in the Last Year: Not on file  . Ran Out of Food in the Last Year: Not on file  Transportation Needs:   . Lack of Transportation (Medical): Not on file  . Lack of Transportation (Non-Medical): Not on file  Physical Activity:   . Days of Exercise per Week: Not on file  . Minutes of Exercise per Session: Not on file  Stress:   . Feeling of Stress : Not on file  Social Connections:   . Frequency of Communication with Friends and Family: Not on file  . Frequency of Social Gatherings with Friends and Family: Not on file  .  Attends Religious Services: Not on file  . Active Member of Clubs or Organizations: Not on file  . Attends Archivist Meetings: Not on file  . Marital Status: Not on file   Allergies  Allergen Reactions  . Penicillins     Respiratory reaction SOB   Family History  Problem Relation Age of Onset  . Uterine cancer Mother   . Breast cancer Mother       Current Outpatient Medications (Respiratory):  Marland Kitchen  Albuterol Sulfate 108 (90 Base) MCG/ACT AEPB, Inhale 2 puffs into the lungs every 4 (four) hours as needed (SOB or wheezing).    Current Outpatient Medications (Other):  .  cholecalciferol (VITAMIN D) 1000 units tablet, Take 1,000 Units by mouth daily. .  Diclofenac Sodium (PENNSAID) 2 % SOLN, Apply 1 pump twice daily. .  Diclofenac  Sodium 2 % SOLN, Place 2 g onto the skin 2 (two) times daily. .  Saw Palmetto, Serenoa repens, (SAW PALMETTO PO), Take 320 mg by mouth daily. .  TURMERIC PO, Take 1,000 mg by mouth daily. .  Diclofenac Sodium (PENNSAID) 2 % SOLN, Place 2 g onto the skin 2 (two) times daily.    Past medical history, social, surgical and family history all reviewed in electronic medical record.  No pertanent information unless stated regarding to the chief complaint.   Review of Systems:  No headache, visual changes, nausea, vomiting, diarrhea, constipation, dizziness, abdominal pain, skin rash, fevers, chills, night sweats, weight loss, swollen lymph nodes, body aches, joint swelling, hest pain, shortness of breath, mood changes.  Positive muscle aches  Objective  Blood pressure 130/80, pulse 79, height 5\' 10"  (1.778 m), weight 207 lb (93.9 kg), SpO2 98 %.   General: No apparent distress alert and oriented x3 mood and affect normal, dressed appropriately.  HEENT: Pupils equal, extraocular movements intact  Respiratory: Patient's speak in full sentences and does not appear short of breath  Cardiovascular: No lower extremity edema, non tender, no erythema  Skin: Warm dry intact with no signs of infection or rash on extremities or on axial skeleton.  Abdomen: Soft nontender  Neuro: Cranial nerves II through XII are intact, neurovascularly intact in all extremities with 2+ DTRs and 2+ pulses.  Lymph: No lymphadenopathy of posterior or anterior cervical chain or axillae bilaterally.  Gait normal with good balance and coordination.  MSK:  Non tender with full range of motion and good stability and symmetric strength and tone of shoulders, elbows, wrist, hip, and ankles bilaterally.  Right knee exam Patient does have some trace effusion.  Tender to palpation over the medial joint space.  Positive McMurray's.  Does have some crepitus noted.  Full range of motion noted.  After informed written and verbal consent,  patient was seated on exam table. Right knee was prepped with alcohol swab and utilizing anterolateral approach, patient's right knee space was injected with 4:1  marcaine 0.5%: Kenalog 40mg /dL. Patient tolerated the procedure well without immediate complications.   Impression and Recommendations:     This case required medical decision making of moderate complexity. The above documentation has been reviewed and is accurate and complete Lyndal Pulley, DO       Note: This dictation was prepared with Dragon dictation along with smaller phrase technology. Any transcriptional errors that result from this process are unintentional.

## 2019-08-13 NOTE — Assessment & Plan Note (Signed)
Injection given today, tolerated the procedure well.  Discussed icing regimen and home exercise, discussed which activities to do which wants to avoid.  Patient is to increase activity slowly over the course the next several weeks.  Patient will follow up with me again in 4 to 8 weeks could be a candidate for viscosupplementation.  If any increasing locking will consider MRI but highly do not think it would change medical management

## 2019-08-13 NOTE — Patient Instructions (Addendum)
  [address], [address] [place], [place] [address] Phone [phone]  Good to see you Pennsaid See me again in 2 months

## 2019-08-27 ENCOUNTER — Ambulatory Visit (AMBULATORY_SURGERY_CENTER): Payer: BC Managed Care – PPO

## 2019-08-27 ENCOUNTER — Other Ambulatory Visit: Payer: Self-pay

## 2019-08-27 VITALS — Temp 98.4°F | Ht 70.0 in | Wt 203.0 lb

## 2019-08-27 DIAGNOSIS — Z1159 Encounter for screening for other viral diseases: Secondary | ICD-10-CM

## 2019-08-27 DIAGNOSIS — Z1211 Encounter for screening for malignant neoplasm of colon: Secondary | ICD-10-CM

## 2019-08-27 NOTE — Progress Notes (Signed)

## 2019-09-03 ENCOUNTER — Encounter: Payer: Self-pay | Admitting: Gastroenterology

## 2019-09-05 ENCOUNTER — Other Ambulatory Visit: Payer: Self-pay | Admitting: Gastroenterology

## 2019-09-05 ENCOUNTER — Ambulatory Visit (INDEPENDENT_AMBULATORY_CARE_PROVIDER_SITE_OTHER): Payer: BC Managed Care – PPO

## 2019-09-05 DIAGNOSIS — Z1159 Encounter for screening for other viral diseases: Secondary | ICD-10-CM

## 2019-09-08 LAB — SARS CORONAVIRUS 2 (TAT 6-24 HRS): SARS Coronavirus 2: NEGATIVE

## 2019-09-10 ENCOUNTER — Ambulatory Visit (AMBULATORY_SURGERY_CENTER): Payer: BC Managed Care – PPO | Admitting: Gastroenterology

## 2019-09-10 ENCOUNTER — Other Ambulatory Visit: Payer: Self-pay

## 2019-09-10 ENCOUNTER — Encounter: Payer: Self-pay | Admitting: Gastroenterology

## 2019-09-10 VITALS — BP 140/84 | HR 73 | Temp 97.3°F | Resp 12 | Ht 70.0 in | Wt 203.0 lb

## 2019-09-10 DIAGNOSIS — D122 Benign neoplasm of ascending colon: Secondary | ICD-10-CM

## 2019-09-10 DIAGNOSIS — Z1211 Encounter for screening for malignant neoplasm of colon: Secondary | ICD-10-CM | POA: Diagnosis not present

## 2019-09-10 HISTORY — PX: COLONOSCOPY: SHX174

## 2019-09-10 MED ORDER — SODIUM CHLORIDE 0.9 % IV SOLN: 500.0000 mL | Freq: Once | INTRAVENOUS | Status: DC

## 2019-09-10 NOTE — Op Note (Signed)
[place] Patient Name: James [name] Procedure Date: 09/10/2019 9:51 AM MRN: [id number] Endoscopist: Milus Banister , MD Age: 69 Referring MD:  Date of Birth: 08/30/1950 Gender: Male Account #: [id number] Procedure:                Colonoscopy Indications:              Screening for colorectal malignant neoplasm Medicines:                Monitored Anesthesia Care Procedure:                Pre-Anesthesia Assessment:                           - Prior to the procedure, a History and Physical                            was performed, and patient medications and                            allergies were reviewed. The patient's tolerance of                            previous anesthesia was also reviewed. The risks                            and benefits of the procedure and the sedation                            options and risks were discussed with the patient.                            All questions were answered, and informed consent                            was obtained. Prior Anticoagulants: The patient has                            taken no previous anticoagulant or antiplatelet                            agents. ASA Grade Assessment: II - A patient with                            mild systemic disease. After reviewing the risks                            and benefits, the patient was deemed in                            satisfactory condition to undergo the procedure.                           After obtaining informed consent, the colonoscope  was passed under direct vision. Throughout the                            procedure, the patient's blood pressure, pulse, and                            oxygen saturations were monitored continuously. The                            Colonoscope was introduced through the anus and                            advanced to the the cecum, identified by                            appendiceal orifice and  ileocecal valve. The                            colonoscopy was performed without difficulty. The                            patient tolerated the procedure well. The quality                            of the bowel preparation was good. The ileocecal                            valve, appendiceal orifice, and rectum were                            photographed. Scope In: 10:01:56 AM Scope Out: 10:16:20 AM Scope Withdrawal Time: 0 hours 10 minutes 23 seconds  Total Procedure Duration: 0 hours 14 minutes 24 seconds  Findings:                 A 4 mm polyp was found in the ascending colon. The                            polyp was sessile. The polyp was removed with a                            cold snare. Resection and retrieval were complete.                           External and internal hemorrhoids were found. The                            hemorrhoids were medium-sized.                           The exam was otherwise without abnormality on                            direct and retroflexion views. Complications:  No immediate complications. Estimated blood loss:                            None. Estimated Blood Loss:     Estimated blood loss: none. Impression:               - One 4 mm polyp in the ascending colon, removed                            with a cold snare. Resected and retrieved.                           - External and internal hemorrhoids.                           - The examination was otherwise normal on direct                            and retroflexion views. Recommendation:           - Patient has a contact number available for                            emergencies. The signs and symptoms of potential                            delayed complications were discussed with the                            patient. Return to normal activities tomorrow.                            Written discharge instructions were provided to the                             patient.                           - Resume previous diet.                           - Continue present medications.                           - Await pathology results. Milus Banister, MD 09/10/2019 10:19:09 AM This report has been signed electronically.

## 2019-09-10 NOTE — Progress Notes (Signed)
Temp JB VS DT  Pt's states no medical or surgical changes since previsit or office visit. 

## 2019-09-10 NOTE — Patient Instructions (Signed)
Polyp removed today and external and internal hemorrhoids noted.     YOU HAD AN ENDOSCOPIC PROCEDURE TODAY AT [facility]:   Refer to the procedure report that was given to you for any specific questions about what was found during the examination.  If the procedure report does not answer your questions, please call your gastroenterologist to clarify.  If you requested that your care partner not be given the details of your procedure findings, then the procedure report has been included in a sealed envelope for you to review at your convenience later.  YOU SHOULD EXPECT: Some feelings of bloating in the abdomen. Passage of more gas than usual.  Walking can help get rid of the air that was put into your GI tract during the procedure and reduce the bloating. If you had a lower endoscopy (such as a colonoscopy or flexible sigmoidoscopy) you may notice spotting of blood in your stool or on the toilet paper. If you underwent a bowel prep for your procedure, you may not have a normal bowel movement for a few days.  Please Note:  You might notice some irritation and congestion in your nose or some drainage.  This is from the oxygen used during your procedure.  There is no need for concern and it should clear up in a day or so.  SYMPTOMS TO REPORT IMMEDIATELY:   Following lower endoscopy (colonoscopy or flexible sigmoidoscopy):  Excessive amounts of blood in the stool  Significant tenderness or worsening of abdominal pains  Swelling of the abdomen that is new, acute  Fever of 100F or higher   For urgent or emergent issues, a gastroenterologist can be reached at any hour by calling [phone].   DIET:  We do recommend a small meal at first, but then you may proceed to your regular diet.  Drink plenty of fluids but you should avoid alcoholic beverages for 24 hours.  ACTIVITY:  You should plan to take it easy for the rest of today and you should NOT DRIVE or use heavy machinery  until tomorrow (because of the sedation medicines used during the test).    FOLLOW UP: Our staff will call the number listed on your records 48-72 hours following your procedure to check on you and address any questions or concerns that you may have regarding the information given to you following your procedure. If we do not reach you, we will leave a message.  We will attempt to reach you two times.  During this call, we will ask if you have developed any symptoms of COVID 19. If you develop any symptoms (ie: fever, flu-like symptoms, shortness of breath, cough etc.) before then, please call [phone].  If you test positive for Covid 19 in the 2 weeks post procedure, please call and report this information to [place].    If any biopsies were taken you will be contacted by phone or by letter within the next 1-3 weeks.  Please call us at [phone] if you have not heard about the biopsies in 3 weeks.    SIGNATURES/CONFIDENTIALITY: You and/or your care partner have signed paperwork which will be entered into your electronic medical record.  These signatures attest to the fact that that the information above on your After Visit Summary has been reviewed and is understood.  Full responsibility of the confidentiality of this discharge information lies with you and/or your care-partner.

## 2019-09-10 NOTE — Progress Notes (Signed)
Called to room to assist during endoscopic procedure.  Patient ID and intended procedure confirmed with present staff. Received instructions for my participation in the procedure from the performing physician.  

## 2019-09-10 NOTE — Progress Notes (Signed)
Report to PACU, RN, vss, BBS= Clear.  

## 2019-09-12 ENCOUNTER — Telehealth: Payer: Self-pay | Admitting: *Deleted

## 2019-09-12 NOTE — Telephone Encounter (Signed)
  Follow up Call-  Call back number 09/10/2019  Post procedure Call Back phone  # [phone]  Permission to leave phone message Yes  Some recent data might be hidden     Patient questions:  Do you have a fever, pain , or abdominal swelling? No. Pain Score  0 *  Have you tolerated food without any problems? Yes.    Have you been able to return to your normal activities? Yes.    Do you have any questions about your discharge instructions: Diet   No. Medications  No. Follow up visit  No.  Do you have questions or concerns about your Care? No.  Actions: * If pain score is 4 or above: 1. No action needed, pain <4.Have you developed a fever since your procedure? no  2.   Have you had an respiratory symptoms (SOB or cough) since your procedure? no  3.   Have you tested positive for COVID 19 since your procedure no  4.   Have you had any family members/close contacts diagnosed with the COVID 19 since your procedure?  no   If yes to any of these questions please route to Joylene John, RN and Alphonsa Gin, Therapist, sports.

## 2019-09-15 ENCOUNTER — Encounter: Payer: Self-pay | Admitting: Gastroenterology

## 2019-10-14 ENCOUNTER — Other Ambulatory Visit: Payer: Self-pay

## 2019-10-14 ENCOUNTER — Ambulatory Visit: Payer: BC Managed Care – PPO | Admitting: Family Medicine

## 2019-10-14 ENCOUNTER — Encounter: Payer: Self-pay | Admitting: Family Medicine

## 2019-10-14 DIAGNOSIS — S83241D Other tear of medial meniscus, current injury, right knee, subsequent encounter: Secondary | ICD-10-CM

## 2019-10-14 NOTE — Progress Notes (Signed)
James [name] Phone: [phone] Subjective:    I'm seeing this patient by the request  of:  Laurey Morale, MD  CC: right knee pain   [id number]   08/13/2019 Injection given today, tolerated the procedure well.  Discussed icing regimen and home exercise, discussed which activities to do which wants to avoid.  Patient is to increase activity slowly over the course the next several weeks.  Patient will follow up with me again in 4 to 8 weeks could be a candidate for viscosupplementation.  If any increasing locking will consider MRI but highly do not think it would change medical management  Update 10/14/2019 James [name] is a 69 y.o. male coming in with complaint of right knee pain. Patient states the knee is healing slowly. Not much issues with swelling. Weight bearing is still somewhat painful. Right knee pain noted.  Would state overall he is feeling 70 to 80% better.  Known mild to moderate arthritic changes of the knee also.     Past Medical History:  Diagnosis Date  . Anterior basement membrane dystrophy    sees Dr. Vevelyn Royals   . Asthma    as a child, resolved   . Cataract    removed 2016  . Osteoarthritis    pt states it was misdiagnosed as Rheumatoid   . 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 CATARACT EXTRACTION, BILATERAL     per Dr. Tommy Rainwater   . EYE SURGERY Left    laser procedure  . sqamous cell     right eyebrow excised   . SUPERFICIAL KERATECTOMY Right 2015   per Dr. Lucita Ferrara    Social History   Socioeconomic History  . Marital status: Married    Spouse name: Not on file  . Number of children: Not on file  . Years of education: Not on file  . Highest education level: Not on file  Occupational History  . Not on  file  Tobacco Use  . Smoking status: Current Every Day Smoker    Packs/day: 0.25    Types: Cigarettes  . Smokeless tobacco: Never Used  . Tobacco comment: pt is weaning self down  Substance and Sexual Activity  . Alcohol use: Yes    Alcohol/week: 0.0 standard drinks    Comment: rare  . Drug use: No  . Sexual activity: Not on file  Other Topics Concern  . Not on file  Social History Narrative  . Not on file   Social Determinants of Health   Financial Resource Strain:   . Difficulty of Paying Living Expenses: Not on file  Food Insecurity:   . Worried About Charity fundraiser in the Last Year: Not on file  . Ran Out of Food in the Last Year: Not on file  Transportation Needs:   . Lack of Transportation (Medical): Not on file  . Lack of Transportation (Non-Medical): Not on file  Physical Activity:   . Days of Exercise per Week: Not on file  . Minutes of Exercise per Session: Not on file  Stress:   . Feeling of Stress : Not on file  Social Connections:   . Frequency of Communication with Friends and Family: Not on file  . Frequency of  Social Gatherings with Friends and Family: Not on file  . Attends Religious Services: Not on file  . Active Member of Clubs or Organizations: Not on file  . Attends Archivist Meetings: Not on file  . Marital Status: Not on file   Allergies  Allergen Reactions  . Penicillins     Respiratory reaction SOB   Family History  Problem Relation Age of Onset  . Uterine cancer Mother   . Breast cancer Mother   . Colon cancer Neg Hx   . Colon polyps Neg Hx   . Esophageal cancer Neg Hx   . Rectal cancer Neg Hx   . Stomach cancer Neg Hx       Current Outpatient Medications (Respiratory):  Marland Kitchen  Albuterol Sulfate 108 (90 Base) MCG/ACT AEPB, Inhale 2 puffs into the lungs every 4 (four) hours as needed (SOB or wheezing).    Current Outpatient Medications (Other):  .  cholecalciferol (VITAMIN D) 1000 units tablet, Take 1,000 Units by  mouth daily. .  Diclofenac Sodium (PENNSAID) 2 % SOLN, Apply 1 pump twice daily. .  Diclofenac Sodium (PENNSAID) 2 % SOLN, Place 2 g onto the skin 2 (two) times daily. .  Diclofenac Sodium 2 % SOLN, Place 2 g onto the skin 2 (two) times daily. .  Saw Palmetto, Serenoa repens, (SAW PALMETTO PO), Take 320 mg by mouth daily. .  TURMERIC PO, Take 1,000 mg by mouth daily.   Reviewed prior external information including notes and imaging from  primary care provider As well as notes that were available from care everywhere and other healthcare systems.  Past medical history, social, surgical and family history all reviewed in electronic medical record.  No pertanent information unless stated regarding to the chief complaint.   Review of Systems:  No headache, visual changes, nausea, vomiting, diarrhea, constipation, dizziness, abdominal pain, skin rash, fevers, chills, night sweats, weight loss, swollen lymph nodes, body aches, joint swelling, chest pain, shortness of breath, mood changes. POSITIVE muscle aches  Objective  Blood pressure 130/80, pulse 81, height 5\' 10"  (1.778 m), weight 216 lb (98 kg), SpO2 97 %.   General: No apparent distress alert and oriented x3 mood and affect normal, dressed appropriately.  HEENT: Pupils equal, extraocular movements intact  Respiratory: Patient's speak in full sentences and does not appear short of breath  Cardiovascular: No lower extremity edema, non tender, no erythema  Skin: Warm dry intact with no signs of infection or rash on extremities or on axial skeleton.  Abdomen: Soft nontender  Neuro: Cranial nerves II through XII are intact, neurovascularly intact in all extremities with 2+ DTRs and 2+ pulses.  Lymph: No lymphadenopathy of posterior or anterior cervical chain or axillae bilaterally.  Gait normal with good balance and coordination.  MSK:  Non tender with full range of motion and good stability and symmetric strength and tone of shoulders,  elbows, wrist, hip, knee and ankles bilaterally.  Mild arthritic changes of multiple joints.    Impression and Recommendations:      The above documentation has been reviewed and is accurate and complete Lyndal Pulley, DO       Note: This dictation was prepared with Dragon dictation along with smaller phrase technology. Any transcriptional errors that result from this process are unintentional.

## 2019-10-14 NOTE — Patient Instructions (Addendum)
Good to see you Keep it going looking good See me again in 2 months if not perfect

## 2019-10-14 NOTE — Assessment & Plan Note (Signed)
Doing relatively well with conservative therapy, hold on any type of injection at the moment.  Discussed icing regimen.  Discussed that if worsening symptoms will need to consider the possibility of MRIs or formal physical therapy.  Patient will follow-up again 4 to 8 weeks.

## 2019-11-11 ENCOUNTER — Encounter: Payer: Self-pay | Admitting: Gastroenterology

## 2019-11-11 ENCOUNTER — Other Ambulatory Visit: Payer: Self-pay

## 2019-11-11 ENCOUNTER — Ambulatory Visit: Payer: BC Managed Care – PPO | Admitting: Gastroenterology

## 2019-11-11 VITALS — BP 122/70 | HR 82 | Temp 97.9°F | Ht 70.0 in | Wt 213.0 lb

## 2019-11-11 DIAGNOSIS — K649 Unspecified hemorrhoids: Secondary | ICD-10-CM

## 2019-11-11 NOTE — Progress Notes (Signed)
Review of pertinent gastrointestinal problems: 1.  Personal history of precancerous colon polyps.  Colonoscopy January 2021 for routine risk screening single subcentimeter adenoma removed.  Recommended repeat colonoscopy at 7-year interval also noted internal/external hemorrhoids.   HPI: This is a very pleasant 69 year old man whom I last saw the time of a screening colonoscopy.  Stools results summarized above.  He is here today discuss his chronically bothersome hemorrhoids.  He has some protrusion especially at times of minor constipation.  This can happen even in times when he is not constipated will you we will feel it protruding hemorrhoid on the right side of his anus.  He sees blood sometimes dripping into the toilet.  He never really has pains or discomforts.  He occasionally uses Preparation H and this tends to work.  He is very interested in the band ligation procedure   Review of systems: Pertinent positive and negative review of systems were noted in the above HPI section. All other review negative.   Past Medical History:  Diagnosis Date  . Anterior basement membrane dystrophy    sees Dr. Vevelyn Royals   . Asthma    as a child, resolved   . Cataract    removed 2016  . Osteoarthritis    pt states it was misdiagnosed as Rheumatoid   . 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 CATARACT EXTRACTION, BILATERAL     per Dr. Tommy Rainwater   . EYE SURGERY Left    laser procedure  . sqamous cell     right eyebrow excised   . SUPERFICIAL KERATECTOMY Right 2015   per Dr. Lucita Ferrara     Current Outpatient Medications  Medication Sig Dispense Refill  . Albuterol Sulfate 108 (90 Base) MCG/ACT AEPB Inhale 2 puffs into the lungs every 4 (four) hours as needed (SOB or wheezing). 1 each 11  . cholecalciferol (VITAMIN D) 1000  units tablet Take 1,000 Units by mouth daily.    . Diclofenac Sodium (PENNSAID) 2 % SOLN Apply 1 pump twice daily. 112 g 3  . Diclofenac Sodium (PENNSAID) 2 % SOLN Place 2 g onto the skin 2 (two) times daily. 112 g 3  . Diclofenac Sodium 2 % SOLN Place 2 g onto the skin 2 (two) times daily. 112 g 3  . Saw Palmetto, Serenoa repens, (SAW PALMETTO PO) Take 320 mg by mouth daily.    . TURMERIC PO Take 1,000 mg by mouth daily.     No current facility-administered medications for this visit.    Allergies as of 11/11/2019 - Review Complete 11/11/2019  Allergen Reaction Noted  . Penicillins  06/08/2014    Family History  Problem Relation Age of Onset  . Uterine cancer Mother   . Breast cancer Mother   . Colon cancer Neg Hx   . Colon polyps Neg Hx   . Esophageal cancer Neg Hx   . Rectal cancer Neg Hx   . Stomach cancer Neg Hx     Social History   Socioeconomic History  . Marital status: Married    Spouse name: Not on file  . Number of children: Not on file  . Years of education: Not on file  . Highest education level: Not on file  Occupational History  . Not on file  Tobacco Use  .  Smoking status: Former Smoker    Packs/day: 0.25    Types: Cigarettes  . Smokeless tobacco: Never Used  . Tobacco comment: quit on January 1st  Substance and Sexual Activity  . Alcohol use: Yes    Alcohol/week: 0.0 standard drinks    Comment: rare  . Drug use: No  . Sexual activity: Not on file  Other Topics Concern  . Not on file  Social History Narrative  . Not on file   Social Determinants of Health   Financial Resource Strain:   . Difficulty of Paying Living Expenses:   Food Insecurity:   . Worried About Charity fundraiser in the Last Year:   . Arboriculturist in the Last Year:   Transportation Needs:   . Film/video editor (Medical):   Marland Kitchen Lack of Transportation (Non-Medical):   Physical Activity:   . Days of Exercise per Week:   . Minutes of Exercise per Session:   Stress:    . Feeling of Stress :   Social Connections:   . Frequency of Communication with Friends and Family:   . Frequency of Social Gatherings with Friends and Family:   . Attends Religious Services:   . Active Member of Clubs or Organizations:   . Attends Archivist Meetings:   Marland Kitchen Marital Status:   Intimate Partner Violence:   . Fear of Current or Ex-Partner:   . Emotionally Abused:   Marland Kitchen Physically Abused:   . Sexually Abused:      Physical Exam: BP 122/70   Pulse 82   Temp 97.9 F (36.6 C)   Ht 5\' 10"  (1.778 m)   Wt 213 lb (96.6 kg)   BMI 30.56 kg/m  Constitutional: generally well-appearing Psychiatric: alert and oriented x3 Eyes: extraocular movements intact Mouth: oral pharynx moist, no lesions Neck: supple no lymphadenopathy Cardiovascular: heart regular rate and rhythm Lungs: clear to auscultation bilaterally Abdomen: soft, nontender, nondistended, no obvious ascites, no peritoneal signs, normal bowel sounds Extremities: no lower extremity edema bilaterally Skin: no lesions on visible extremities Rectal exam deferred since he just had a colonoscopy 2 months ago  Assessment and plan: 69 y.o. male with internal, external hemorrhoids  We discussed the internal hemorrhoid banding procedure which is offered here and he is very interested in proceeding.  I see no reason for any further blood tests or imaging studies prior to then.    Please see the "Patient Instructions" section for addition details about the plan.   Owens Loffler, MD [facility] 11/11/2019, 2:21 PM  Cc: Laurey Morale, MD  Total time on date of encounter was 45  minutes (this included time spent preparing to see the patient reviewing records; obtaining and/or reviewing separately obtained history; performing a medically appropriate exam and/or evaluation; counseling and educating the patient and family if present; ordering medications, tests or procedures if applicable; and  documenting clinical information in the health record).

## 2019-11-11 NOTE — Patient Instructions (Signed)
You have been scheduled for hemorrhoidal banding on 11/20/19 at 3:50 pm with Dr Silverio Decamp.  If you are age 71 or older, your body mass index should be between 23-30. Your Body mass index is 30.56 kg/m. If this is out of the aforementioned range listed, please consider follow up with your Primary Care Provider.  If you are age 33 or younger, your body mass index should be between 19-25. Your Body mass index is 30.56 kg/m. If this is out of the aformentioned range listed, please consider follow up with your Primary Care Provider.

## 2019-11-20 ENCOUNTER — Ambulatory Visit: Payer: BC Managed Care – PPO | Admitting: Gastroenterology

## 2019-11-20 ENCOUNTER — Encounter: Payer: Self-pay | Admitting: Gastroenterology

## 2019-11-20 VITALS — BP 124/80 | HR 68 | Temp 98.1°F | Ht 70.0 in | Wt 214.0 lb

## 2019-11-20 DIAGNOSIS — K625 Hemorrhage of anus and rectum: Secondary | ICD-10-CM | POA: Diagnosis not present

## 2019-11-20 DIAGNOSIS — K641 Second degree hemorrhoids: Secondary | ICD-10-CM | POA: Diagnosis not present

## 2019-11-20 NOTE — Progress Notes (Signed)
PROCEDURE NOTE: The patient presents with symptomatic grade II hemorrhoids, requesting rubber band ligation of his/her hemorrhoidal disease.  All risks, benefits and alternative forms of therapy were described and informed consent was obtained.  In the Left Lateral Decubitus position anoscopic examination revealed grade II hemorrhoids in the right posterior, left lateral and right anterior position(s).  The anorectum was pre-medicated with 0.125% NTG and Recticare The decision was made to band the right posterior internal hemorrhoid, and the [place] was used to perform band ligation without complication.  Digital anorectal examination was then performed to assure proper positioning of the band, and to adjust the banded tissue as required.  The patient was discharged home without pain or other issues.  Dietary and behavioral recommendations were given and along with follow-up instructions.     The following adjunctive treatments were recommended: Continue fiber supplements  The patient will return in 2-4 weeks for  follow-up and possible additional banding as required. No complications were encountered and the patient tolerated the procedure well.  Damaris Hippo , MD [phone]

## 2019-11-20 NOTE — Patient Instructions (Signed)

## 2019-12-08 ENCOUNTER — Telehealth: Payer: Self-pay | Admitting: Gastroenterology

## 2019-12-08 NOTE — Telephone Encounter (Signed)
Beth, please schedule him for a visit with me tomorrow. Will check CBC before he come in for his appointment.  He will need to come to ER if bleeding is worse.

## 2019-12-08 NOTE — Telephone Encounter (Signed)
Patient agrees to this plan. He will come for the labs tomorrow morning. He will come back to [place] at 3:40 pm.

## 2019-12-08 NOTE — Telephone Encounter (Signed)
Spoke with this very nice patient. He states he got up this morning and dressed, then felt this "leak" sensation at the rectum. No pain or burning. He checked his underwear and there he had bled enough to soak through to his jeans. He cleaned himself and found clots. He estimated a total of maybe 10 cc's. He had a "slick" bowel movement. Describes it that way trying to explain it was soft soft. Not constipated. He did see blood with that bowel movement. Denies any pain with the bowel movement. States he always has a little burning or itching. No large prolapsed hemorrhoids since the banding. He mentions a concern that eating 2 bags of popcorn at night may contribute by the roughage passed.. No strenuous activities.  Please advise.  His next appointment is 12/21/19.

## 2019-12-08 NOTE — Telephone Encounter (Signed)
Pt called stating that he has been experiencing bleeding since his banding appt. He would l ike a cb asap.

## 2019-12-09 ENCOUNTER — Ambulatory Visit: Payer: BC Managed Care – PPO | Admitting: Gastroenterology

## 2019-12-09 ENCOUNTER — Ambulatory Visit: Payer: BC Managed Care – PPO | Admitting: Family Medicine

## 2019-12-09 ENCOUNTER — Other Ambulatory Visit: Payer: Self-pay

## 2019-12-09 ENCOUNTER — Encounter: Payer: Self-pay | Admitting: Gastroenterology

## 2019-12-09 ENCOUNTER — Encounter: Payer: Self-pay | Admitting: Family Medicine

## 2019-12-09 ENCOUNTER — Other Ambulatory Visit (INDEPENDENT_AMBULATORY_CARE_PROVIDER_SITE_OTHER): Payer: BC Managed Care – PPO

## 2019-12-09 VITALS — BP 130/86 | HR 80 | Temp 98.1°F | Ht 70.0 in | Wt 212.5 lb

## 2019-12-09 DIAGNOSIS — K625 Hemorrhage of anus and rectum: Secondary | ICD-10-CM

## 2019-12-09 DIAGNOSIS — S83241D Other tear of medial meniscus, current injury, right knee, subsequent encounter: Secondary | ICD-10-CM | POA: Diagnosis not present

## 2019-12-09 DIAGNOSIS — K642 Third degree hemorrhoids: Secondary | ICD-10-CM | POA: Diagnosis not present

## 2019-12-09 LAB — CBC WITH DIFFERENTIAL/PLATELET
Basophils Absolute: 0.1 10*3/uL (ref 0.0–0.1)
Basophils Relative: 1.2 % (ref 0.0–3.0)
Eosinophils Absolute: 0.2 10*3/uL (ref 0.0–0.7)
Eosinophils Relative: 2.2 % (ref 0.0–5.0)
HCT: 40.8 % (ref 39.0–52.0)
Hemoglobin: 14 g/dL (ref 13.0–17.0)
Lymphocytes Relative: 37.7 % (ref 12.0–46.0)
Lymphs Abs: 2.9 10*3/uL (ref 0.7–4.0)
MCHC: 34.2 g/dL (ref 30.0–36.0)
MCV: 99 fl (ref 78.0–100.0)
Monocytes Absolute: 0.8 10*3/uL (ref 0.1–1.0)
Monocytes Relative: 11 % (ref 3.0–12.0)
Neutro Abs: 3.7 10*3/uL (ref 1.4–7.7)
Neutrophils Relative %: 47.9 % (ref 43.0–77.0)
Platelets: 250 10*3/uL (ref 150.0–400.0)
RBC: 4.12 Mil/uL — ABNORMAL LOW (ref 4.22–5.81)
RDW: 12.3 % (ref 11.5–15.5)
WBC: 7.6 10*3/uL (ref 4.0–10.5)

## 2019-12-09 MED ORDER — HYDROCORTISONE ACETATE 25 MG RE SUPP: 25.0000 mg | Freq: Every evening | RECTAL | 1 refills | Status: DC | PRN

## 2019-12-09 NOTE — Assessment & Plan Note (Signed)
Doing well overall, discuss HEP, discussed which activities to do and which ones to avoid. RTC in 8 weeks

## 2019-12-09 NOTE — Progress Notes (Signed)
James [name]    [id number]    Nov 15, 1950  Primary Care Physician:Fry, Ishmael Holter, MD  Referring Physician: Laurey Morale, MD [place],  [place] [address]   Chief complaint: Rectal bleeding HPI:  69 year old male here with complaints of bright red blood per rectum.  He had an episode of rectal bleeding last evening, passed gelatinous clots and he had an additional episode of bright red blood this morning.  Subsequently had a normal bowel movement with no blood this afternoon  CBC Latest Ref Rng & Units 12/09/2019 07/22/2019 06/08/2014  WBC 4.0 - 10.5 K/uL 7.6 9.6 8.3  Hemoglobin 13.0 - 17.0 g/dL 14.0 14.6 15.0  Hematocrit 39.0 - 52.0 % 40.8 43.4 44.8  Platelets 150.0 - 400.0 K/uL 250.0 225.0 230.0   Denies any dizziness, shortness of breath, change in bowel habits, excessive straining or rectal discomfort.  He is s/p hemorrhoidal band ligation November 20, 2019  Colonoscopy September 10, 2019 by Dr. Ardis Hughs: 4 mm polyp removed from ascending colon, internal and external hemorrhoids otherwise unremarkable exam.   Outpatient Encounter Medications as of 12/09/2019  Medication Sig  . Albuterol Sulfate 108 (90 Base) MCG/ACT AEPB Inhale 2 puffs into the lungs every 4 (four) hours as needed (SOB or wheezing).  . cholecalciferol (VITAMIN D) 1000 units tablet Take 1,000 Units by mouth daily.  . Diclofenac Sodium 2 % SOLN Place 2 g onto the skin 2 (two) times daily. (Patient taking differently: Place 2 g onto the skin as needed. )  . FIBER PO Take by mouth daily. Organic fiber  . Garlic 10 MG CAPS Take by mouth daily.  Marland Kitchen glucosamine-chondroitin 500-400 MG tablet Take 2 tablets by mouth.  . NON FORMULARY Takes organic vitamins  . OVER THE COUNTER MEDICATION at bedtime. Tarte cherry  . Saw Palmetto, Serenoa repens, (SAW PALMETTO PO) Take 320 mg by mouth daily.  . TURMERIC PO Take 1,000 mg by mouth daily.   No facility-administered encounter medications  on file as of 12/09/2019.    Allergies as of 12/09/2019 - Review Complete 12/09/2019  Allergen Reaction Noted  . Penicillins  06/08/2014    Past Medical History:  Diagnosis Date  . Anterior basement membrane dystrophy    sees Dr. Vevelyn Royals   . Asthma    as a child, resolved   . Cataract    removed 2016  . Osteoarthritis    pt states it was misdiagnosed as Rheumatoid   . 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 CATARACT EXTRACTION, BILATERAL     per Dr. Tommy Rainwater   . EYE SURGERY Left    laser procedure  . sqamous cell     right eyebrow excised   . SUPERFICIAL KERATECTOMY Right 2015   per Dr. Lucita Ferrara     Family History  Problem Relation Age of Onset  . Uterine cancer Mother   . Breast cancer Mother   . Colon cancer Neg Hx   . Colon polyps Neg Hx   . Esophageal cancer Neg Hx   . Rectal cancer Neg Hx   . Stomach cancer Neg Hx     Social History   Socioeconomic History  . Marital status: Married    Spouse name: Not on  file  . Number of children: Not on file  . Years of education: Not on file  . Highest education level: Not on file  Occupational History  . Not on file  Tobacco Use  . Smoking status: Former Smoker    Packs/day: 0.25    Types: Cigarettes  . Smokeless tobacco: Never Used  . Tobacco comment: quit on January 1st  Substance and Sexual Activity  . Alcohol use: Yes    Alcohol/week: 0.0 standard drinks    Comment: rare  . Drug use: No  . Sexual activity: Not on file  Other Topics Concern  . Not on file  Social History Narrative  . Not on file   Social Determinants of Health   Financial Resource Strain:   . Difficulty of Paying Living Expenses:   Food Insecurity:   . Worried About Charity fundraiser in the Last Year:   . Arboriculturist in the Last Year:   Transportation  Needs:   . Film/video editor (Medical):   Marland Kitchen Lack of Transportation (Non-Medical):   Physical Activity:   . Days of Exercise per Week:   . Minutes of Exercise per Session:   Stress:   . Feeling of Stress :   Social Connections:   . Frequency of Communication with Friends and Family:   . Frequency of Social Gatherings with Friends and Family:   . Attends Religious Services:   . Active Member of Clubs or Organizations:   . Attends Archivist Meetings:   Marland Kitchen Marital Status:   Intimate Partner Violence:   . Fear of Current or Ex-Partner:   . Emotionally Abused:   Marland Kitchen Physically Abused:   . Sexually Abused:       Review of systems: All other review of systems negative except as mentioned in the HPI.   Physical Exam: Vitals:   12/09/19 1541  BP: 130/86  Pulse: 80  Temp: 98.1 F (36.7 C)   Body mass index is 30.49 kg/m. Gen:      No acute distress Abd:      soft, non-tender; no palpable masses, no distension Neuro: alert and oriented x 3 Psych: normal mood and affect Rectal exam: Normal anal sphincter tone, no anal fissure or external hemorrhoids Anoscopy: Large internal hemorrhoids in the right lateral and right posterior position with evidence of active bleeding with mucosal tear on left lateral hemorrhoid.  Healed prior banding site right posterior  Data Reviewed:  Reviewed labs, radiology imaging, old records and pertinent past GI work up   Assessment and Plan/Recommendations:  69 year old male with large symptomatic hemorrhoids s/p hemorrhoidal band ligation November 20, 2019 with bright red blood per rectum likely secondary to hemorrhage from internal hemorrhoids  Hemoglobin and hematocrit stable compared to prior CBC  Evidence of recent bleeding from left lateral hemorrhoid based on anoscopy   All risks, benefits and alternative forms of therapy were described and informed consent was obtained.  The anorectum was pre-medicated with 0.125%  nitroglycerin and RectiCare The decision was made to band the left lateral internal hemorrhoid, and the [place] was used to perform band ligation without complication.  Digital anorectal examination was then performed to assure proper positioning of the band, and to adjust the banded tissue as required.  The patient was discharged home without pain or other issues.  Dietary and behavioral recommendations were given and along with follow-up instructions.     The following adjunctive treatments were recommended:  Benefiber 1  tablespoon 2-3 times daily with meals Anusol suppository per rectum at bedtime as needed, avoid using it in the next 5 days  The patient will return in 2 to 4 weeks for  follow-up and possible additional banding as required. No complications were encountered and the patient tolerated the procedure well.   The patient was provided an opportunity to ask questions and all were answered. The patient agreed with the plan and demonstrated an understanding of the instructions.  Damaris Hippo , MD    CC: Laurey Morale, MD

## 2019-12-09 NOTE — Patient Instructions (Addendum)
Good to see you Use pennsaid regularly Take out orthotics for a week to see if that helps Stop 1 vitamin a week at a time to see how you feel See me again in 2-3 months

## 2019-12-09 NOTE — Progress Notes (Signed)
[facility] [address] [place] Phone: [phone] Subjective:   I James [name] am serving as a Education administrator for Dr. Hulan Saas.  This visit occurred during the SARS-CoV-2 public health emergency.  Safety protocols were in place, including screening questions prior to the visit, additional usage of staff PPE, and extensive cleaning of exam room while observing appropriate contact time as indicated for disinfecting solutions.   I'm seeing this patient by the request  of:  Laurey Morale, MD  CC: knee pain   [id number]   2/16/20021 Doing relatively well with conservative therapy, hold on any type of injection at the moment.  Discussed icing regimen.  Discussed that if worsening symptoms will need to consider the possibility of MRIs or formal physical therapy.  Patient will follow-up again 4 to 8 weeks.  Update 12/09/2019 James [name] is a 69 y.o. male coming in with complaint of right knee pain. Patient states he feels the knee is healing well. Would like to talking about orthotics due to lateral knee pain bilaterally.  overall continue to improve at the moment.        Past Medical History:  Diagnosis Date  . Anterior basement membrane dystrophy    sees Dr. Vevelyn Royals   . Asthma    as a child, resolved   . Cataract    removed 2016  . Osteoarthritis    pt states it was misdiagnosed as Rheumatoid   . 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 CATARACT EXTRACTION, BILATERAL     per Dr. Tommy Rainwater   . EYE SURGERY Left    laser procedure  . sqamous cell     right eyebrow excised   . SUPERFICIAL KERATECTOMY Right 2015   per Dr. Lucita Ferrara    Social History   Socioeconomic History  . Marital status: Married    Spouse name: Not on file  . Number of children: Not  on file  . Years of education: Not on file  . Highest education level: Not on file  Occupational History  . Not on file  Tobacco Use  . Smoking status: Former Smoker    Packs/day: 0.25    Types: Cigarettes  . Smokeless tobacco: Never Used  . Tobacco comment: quit on January 1st  Substance and Sexual Activity  . Alcohol use: Yes    Alcohol/week: 0.0 standard drinks    Comment: rare  . Drug use: No  . Sexual activity: Not on file  Other Topics Concern  . Not on file  Social History Narrative  . Not on file   Social Determinants of Health   Financial Resource Strain:   . Difficulty of Paying Living Expenses:   Food Insecurity:   . Worried About Charity fundraiser in the Last Year:   . Arboriculturist in the Last Year:   Transportation Needs:   . Film/video editor (Medical):   Marland Kitchen Lack of Transportation (Non-Medical):   Physical Activity:   . Days of Exercise per Week:   . Minutes of Exercise per Session:   Stress:   . Feeling of Stress :   Social Connections:   . Frequency of Communication with Friends and Family:   . Frequency of Social Gatherings with Friends and  Family:   . Attends Religious Services:   . Active Member of Clubs or Organizations:   . Attends Archivist Meetings:   Marland Kitchen Marital Status:    Allergies  Allergen Reactions  . Penicillins     Respiratory reaction SOB   Family History  Problem Relation Age of Onset  . Uterine cancer Mother   . Breast cancer Mother   . Colon cancer Neg Hx   . Colon polyps Neg Hx   . Esophageal cancer Neg Hx   . Rectal cancer Neg Hx   . Stomach cancer Neg Hx       Current Outpatient Medications (Respiratory):  Marland Kitchen  Albuterol Sulfate 108 (90 Base) MCG/ACT AEPB, Inhale 2 puffs into the lungs every 4 (four) hours as needed (SOB or wheezing).    Current Outpatient Medications (Other):  .  cholecalciferol (VITAMIN D) 1000 units tablet, Take 1,000 Units by mouth daily. .  Diclofenac Sodium 2 % SOLN, Place  2 g onto the skin 2 (two) times daily. .  Saw Palmetto, Serenoa repens, (SAW PALMETTO PO), Take 320 mg by mouth daily. .  TURMERIC PO, Take 1,000 mg by mouth daily.   Reviewed prior external information including notes and imaging from  primary care provider As well as notes that were available from care everywhere and other healthcare systems.  Past medical history, social, surgical and family history all reviewed in electronic medical record.  No pertanent information unless stated regarding to the chief complaint.   Review of Systems:  No headache, visual changes, nausea, vomiting, diarrhea, constipation, dizziness, abdominal pain, skin rash, fevers, chills, night sweats, weight loss, swollen lymph nodes, body aches, joint swelling, chest pain, shortness of breath, mood changes. POSITIVE muscle aches  Objective  Blood pressure 124/82, pulse 79, height 5\' 10"  (1.778 m), weight 212 lb (96.2 kg), SpO2 97 %.   General: No apparent distress alert and oriented x3 mood and affect normal, dressed appropriately.  HEENT: Pupils equal, extraocular movements intact  Respiratory: Patient's speak in full sentences and does not appear short of breath  Cardiovascular: No lower extremity edema, non tender, no erythema  Neuro: Cranial nerves II through XII are intact, neurovascularly intact in all extremities with 2+ DTRs and 2+ pulses.  Gait normal with good balance and coordination.  MSK:  Right knee pain. Still medial, mild positive mcmurry still noted, no more instability       Impression and Recommendations:      The above documentation has been reviewed and is accurate and complete Lyndal Pulley, DO       Note: This dictation was prepared with Dragon dictation along with smaller phrase technology. Any transcriptional errors that result from this process are unintentional.

## 2019-12-09 NOTE — Patient Instructions (Signed)
HEMORRHOID BANDING PROCEDURE    FOLLOW-UP CARE   1. The procedure you have had should have been relatively painless since the banding of the area involved does not have nerve endings and there is no pain sensation.  The rubber band cuts off the blood supply to the hemorrhoid and the band may fall off as soon as 48 hours after the banding (the band may occasionally be seen in the toilet bowl following a bowel movement). You may notice a temporary feeling of fullness in the rectum which should respond adequately to plain Tylenol or Motrin.  2. Following the banding, avoid strenuous exercise that evening and resume full activity the next day.  A sitz bath (soaking in a warm tub) or bidet is soothing, and can be useful for cleansing the area after bowel movements.     3. To avoid constipation, take two tablespoons of natural wheat bran, natural oat bran, flax, Benefiber or any over the counter fiber supplement and increase your water intake to 7-8 glasses daily.    4. Unless you have been prescribed anorectal medication, do not put anything inside your rectum for two weeks: No suppositories, enemas, fingers, etc.  5. Occasionally, you may have more bleeding than usual after the banding procedure.  This is often from the untreated hemorrhoids rather than the treated one.  Don't be concerned if there is a tablespoon or so of blood.  If there is more blood than this, lie flat with your bottom higher than your head and apply an ice pack to the area. If the bleeding does not stop within a half an hour or if you feel faint, call our office at [phone] or go to the emergency room.  6. Problems are not common; however, if there is a substantial amount of bleeding, severe pain, chills, fever or difficulty passing urine (very rare) or other problems, you should call us at [phone] or report to the nearest emergency room.  7. Do not stay seated continuously for more than 2-3 hours for a day or two  after the procedure.  Tighten your buttock muscles 10-15 times every two hours and take 10-15 deep breaths every 1-2 hours.  Do not spend more than a few minutes on the toilet if you cannot empty your bowel; instead re-visit the toilet at a later time.    If you are age 61 or older, your body mass index should be between 23-30. Your Body mass index is 30.49 kg/m. If this is out of the aforementioned range listed, please consider follow up with your Primary Care Provider.  If you are age 10 or younger, your body mass index should be between 19-25. Your Body mass index is 30.49 kg/m. If this is out of the aformentioned range listed, please consider follow up with your Primary Care Provider  Thank you for choosing me and [facility].  Dr. Silverio Decamp

## 2019-12-10 ENCOUNTER — Telehealth: Payer: Self-pay | Admitting: Gastroenterology

## 2019-12-10 DIAGNOSIS — M1990 Unspecified osteoarthritis, unspecified site: Secondary | ICD-10-CM | POA: Diagnosis not present

## 2019-12-10 DIAGNOSIS — K625 Hemorrhage of anus and rectum: Secondary | ICD-10-CM | POA: Diagnosis not present

## 2019-12-10 DIAGNOSIS — Z880 Allergy status to penicillin: Secondary | ICD-10-CM | POA: Diagnosis not present

## 2019-12-10 DIAGNOSIS — Z79899 Other long term (current) drug therapy: Secondary | ICD-10-CM | POA: Diagnosis not present

## 2019-12-10 DIAGNOSIS — Z87891 Personal history of nicotine dependence: Secondary | ICD-10-CM | POA: Diagnosis not present

## 2019-12-10 DIAGNOSIS — R42 Dizziness and giddiness: Secondary | ICD-10-CM | POA: Diagnosis not present

## 2019-12-10 DIAGNOSIS — J449 Chronic obstructive pulmonary disease, unspecified: Secondary | ICD-10-CM | POA: Diagnosis not present

## 2019-12-10 NOTE — Telephone Encounter (Signed)
Pt had banding with Dr. Silverio Decamp yesterday. He states that he has been passing a "massive" amount of blood since 23 minutes ago. He would like to know if he can apply the suppositories that he was prescribed. He was told to being using them few weeks after banding but pt does not know how to stop bleeding. Pls call him.

## 2019-12-11 ENCOUNTER — Telehealth: Payer: Self-pay | Admitting: Gastroenterology

## 2019-12-11 NOTE — Telephone Encounter (Signed)
Patient is calling to follow up and update since his visit at ER. He said the bleeding has stopped and he did have a MB which seemed to be normal so far he is not sure if he needs to be scheduled for an OV prior to his 2nd banding. Please advise

## 2019-12-11 NOTE — Telephone Encounter (Signed)
Dr Silverio Decamp see note from pt, his bleeding has stopped.  He has had a normal BM and is not sure if he needs an office visit prior to next banding appt.on 4/26.  Please advise

## 2019-12-11 NOTE — Telephone Encounter (Signed)
Noted, pt was seen at [facility] ER.

## 2019-12-12 NOTE — Telephone Encounter (Signed)
The pt has been that he does not need an appt prior to next banding.  The pt has been advised of the information and verbalized understanding.

## 2019-12-12 NOTE — Telephone Encounter (Signed)
We can cancel office visit and keep the hemorrhoidal banding appointment. Thank you

## 2019-12-22 ENCOUNTER — Encounter: Payer: Self-pay | Admitting: Gastroenterology

## 2019-12-22 ENCOUNTER — Ambulatory Visit: Payer: BC Managed Care – PPO | Admitting: Gastroenterology

## 2019-12-22 VITALS — BP 132/76 | HR 84 | Temp 98.2°F | Ht 70.0 in | Wt 212.4 lb

## 2019-12-22 DIAGNOSIS — K641 Second degree hemorrhoids: Secondary | ICD-10-CM

## 2019-12-22 DIAGNOSIS — K625 Hemorrhage of anus and rectum: Secondary | ICD-10-CM

## 2019-12-22 NOTE — Patient Instructions (Signed)
If you are age 71 or older, your body mass index should be between 23-30. Your Body mass index is 30.47 kg/m. If this is out of the aforementioned range listed, please consider follow up with your Primary Care Provider.  If you are age 26 or younger, your body mass index should be between 19-25. Your Body mass index is 30.47 kg/m. If this is out of the aformentioned range listed, please consider follow up with your Primary Care Provider.    Follow-up with our office as needed.   Thank you for choosing me and [facility].  Dr. Silverio Decamp

## 2019-12-22 NOTE — Progress Notes (Signed)
James [name]    [id number]    03-Jan-1951  Primary Care Physician:Fry, Ishmael Holter, MD  Referring Physician: Laurey Morale, MD [place],  [place] [address]   Chief complaint:  Hemorrhoids, BRBRP  HPI: 69 year old male with symptomatic hemorrhoids. S/p internal hemorrhoid band ligation on November 20, 2019 and December 09, 2019. He was seen in office on April 13 for rectal bleeding, which appeared to be secondary to small-volume hemorrhage from internal hemorrhoids which was band ligated during the visit.  He had subsequently for additional small-volume rectal bleeding, presented to the ER. He was monitored with stable hemoglobin and no further bleeding and was discharged home. He has not had any further episodes of rectal bleeding.  CBC Latest Ref Rng & Units 12/09/2019 07/22/2019 06/08/2014  WBC 4.0 - 10.5 K/uL 7.6 9.6 8.3  Hemoglobin 13.0 - 17.0 g/dL 14.0 14.6 15.0  Hematocrit 39.0 - 52.0 % 40.8 43.4 44.8  Platelets 150.0 - 400.0 K/uL 250.0 225.0 230.0    Outpatient Encounter Medications as of 12/22/2019  Medication Sig  . Albuterol Sulfate 108 (90 Base) MCG/ACT AEPB Inhale 2 puffs into the lungs every 4 (four) hours as needed (SOB or wheezing).  . cholecalciferol (VITAMIN D) 1000 units tablet Take 1,000 Units by mouth daily.  . Diclofenac Sodium 2 % SOLN Place 2 g onto the skin 2 (two) times daily. (Patient taking differently: Place 2 g onto the skin as needed. )  . FIBER PO Take by mouth daily. Organic fiber  . Garlic 10 MG CAPS Take by mouth daily.  Marland Kitchen glucosamine-chondroitin 500-400 MG tablet Take 2 tablets by mouth.  . hydrocortisone (ANUSOL-HC) 25 MG suppository Place 1 suppository (25 mg total) rectally at bedtime as needed for hemorrhoids or anal itching.  . NON FORMULARY Takes organic vitamins  . OVER THE COUNTER MEDICATION at bedtime. Tarte cherry  . Saw Palmetto, Serenoa repens, (SAW PALMETTO PO) Take 320 mg by mouth daily.  . TURMERIC PO  Take 1,000 mg by mouth daily.   No facility-administered encounter medications on file as of 12/22/2019.    Allergies as of 12/22/2019 - Review Complete 12/22/2019  Allergen Reaction Noted  . Penicillins  06/08/2014    Past Medical History:  Diagnosis Date  . Anterior basement membrane dystrophy    sees Dr. Vevelyn Royals   . Asthma    as a child, resolved   . Cataract    removed 2016  . Osteoarthritis    pt states it was misdiagnosed as Rheumatoid   . 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 CATARACT EXTRACTION, BILATERAL     per Dr. Tommy Rainwater   . EYE SURGERY Left    laser procedure  . sqamous cell     right eyebrow excised   . SUPERFICIAL KERATECTOMY Right 2015   per Dr. Lucita Ferrara     Family History  Problem Relation Age of Onset  . Uterine cancer Mother   . Breast cancer Mother   . Colon cancer Neg Hx   . Colon polyps Neg Hx   . Esophageal cancer Neg Hx   . Rectal cancer Neg Hx   . Stomach cancer Neg Hx     Social History   Socioeconomic History  .  Marital status: Married    Spouse name: Not on file  . Number of children: Not on file  . Years of education: Not on file  . Highest education level: Not on file  Occupational History  . Not on file  Tobacco Use  . Smoking status: Former Smoker    Packs/day: 0.25    Types: Cigarettes  . Smokeless tobacco: Never Used  . Tobacco comment: quit on January 1st  Substance and Sexual Activity  . Alcohol use: Yes    Alcohol/week: 0.0 standard drinks    Comment: rare  . Drug use: No  . Sexual activity: Not on file  Other Topics Concern  . Not on file  Social History Narrative  . Not on file   Social Determinants of Health   Financial Resource Strain:   . Difficulty of Paying Living Expenses:   Food Insecurity:   . Worried About Sales executive in the Last Year:   . Arboriculturist in the Last Year:   Transportation Needs:   . Film/video editor (Medical):   Marland Kitchen Lack of Transportation (Non-Medical):   Physical Activity:   . Days of Exercise per Week:   . Minutes of Exercise per Session:   Stress:   . Feeling of Stress :   Social Connections:   . Frequency of Communication with Friends and Family:   . Frequency of Social Gatherings with Friends and Family:   . Attends Religious Services:   . Active Member of Clubs or Organizations:   . Attends Archivist Meetings:   Marland Kitchen Marital Status:   Intimate Partner Violence:   . Fear of Current or Ex-Partner:   . Emotionally Abused:   Marland Kitchen Physically Abused:   . Sexually Abused:       Review of systems: All other review of systems negative except as mentioned in the HPI.   Physical Exam: Vitals:   12/22/19 1536  BP: 132/76  Pulse: 84  Temp: 98.2 F (36.8 C)  SpO2: 97%   Body mass index is 30.47 kg/m. Gen:      No acute distress Neuro: alert and oriented x 3 Psych: normal mood and affect Rectal exam: Normal anal sphincter tone, no anal fissure or external hemorrhoids Anoscopy: Small 1-2 internal hemorrhoids, no active bleeding, normal dentate line, no visible nodules   Data Reviewed:  Reviewed labs, radiology imaging, old records and pertinent past GI work up   Assessment and Plan/Recommendations:  69 year old male with symptomatic internal hemorrhoids, small-volume hemorrhage from left lateral hemorrhoid s/p band ligation. No further bleeding.  He still has grade 2 hemorrhoids in the right posterior position, but given he is currently asymptomatic okay to continue to monitor No evidence of mucosal ulceration or bleeding from prior banding sites on anoscopy  Benefiber 1 tablespoon twice daily with meals Use Anusol or Preparation H suppository per rectum at bedtime as needed  Return for third hemorrhoidal band ligation if needed    The patient  was provided an opportunity to ask questions and all were answered. The patient agreed with the plan and demonstrated an understanding of the instructions.  Damaris Hippo , MD    CC: Laurey Morale, MD

## 2019-12-23 ENCOUNTER — Encounter: Payer: Self-pay | Admitting: Gastroenterology

## 2020-01-02 ENCOUNTER — Encounter: Payer: BC Managed Care – PPO | Admitting: Gastroenterology

## 2020-01-19 ENCOUNTER — Encounter: Payer: BC Managed Care – PPO | Admitting: Gastroenterology

## 2020-01-23 ENCOUNTER — Ambulatory Visit: Payer: BC Managed Care – PPO | Admitting: Gastroenterology

## 2020-01-23 ENCOUNTER — Encounter: Payer: Self-pay | Admitting: Gastroenterology

## 2020-01-23 VITALS — BP 114/64 | HR 75 | Ht 70.0 in | Wt 212.2 lb

## 2020-01-23 DIAGNOSIS — K641 Second degree hemorrhoids: Secondary | ICD-10-CM

## 2020-01-23 NOTE — Progress Notes (Signed)
PROCEDURE NOTE: The patient presents with symptomatic grade II-III  hemorrhoids, requesting rubber band ligation of his/her hemorrhoidal disease.  All risks, benefits and alternative forms of therapy were described and informed consent was obtained.  In the Left Lateral Decubitus position anoscopic examination revealed grade II-III hemorrhoids in the right posterior position(s).  The anorectum was pre-medicated with 0.125% NTG and recticare The decision was made to band the right posterior internal hemorrhoid, and the [place] was used to perform band ligation without complication.  Digital anorectal examination was then performed to assure proper positioning of the band, and to adjust the banded tissue as required.  The patient was discharged home without pain or other issues.  Dietary and behavioral recommendations were given and along with follow-up instructions.     The following adjunctive treatments were recommended:  Benefiber 1 tablespoon twice daily  The patient will return for  follow-up and possible additional banding as required. No complications were encountered and the patient tolerated the procedure well.  Damaris Hippo , MD [phone]

## 2020-01-23 NOTE — Patient Instructions (Signed)
HEMORRHOID BANDING PROCEDURE    FOLLOW-UP CARE   1. The procedure you have had should have been relatively painless since the banding of the area involved does not have nerve endings and there is no pain sensation.  The rubber band cuts off the blood supply to the hemorrhoid and the band may fall off as soon as 48 hours after the banding (the band may occasionally be seen in the toilet bowl following a bowel movement). You may notice a temporary feeling of fullness in the rectum which should respond adequately to plain Tylenol or Motrin.  2. Following the banding, avoid strenuous exercise that evening and resume full activity the next day.  A sitz bath (soaking in a warm tub) or bidet is soothing, and can be useful for cleansing the area after bowel movements.     3. To avoid constipation, take two tablespoons of natural wheat bran, natural oat bran, flax, Benefiber or any over the counter fiber supplement and increase your water intake to 7-8 glasses daily.    4. Unless you have been prescribed anorectal medication, do not put anything inside your rectum for two weeks: No suppositories, enemas, fingers, etc.  5. Occasionally, you may have more bleeding than usual after the banding procedure.  This is often from the untreated hemorrhoids rather than the treated one.  Don't be concerned if there is a tablespoon or so of blood.  If there is more blood than this, lie flat with your bottom higher than your head and apply an ice pack to the area. If the bleeding does not stop within a half an hour or if you feel faint, call our office at [phone] or go to the emergency room.  6. Problems are not common; however, if there is a substantial amount of bleeding, severe pain, chills, fever or difficulty passing urine (very rare) or other problems, you should call us at [phone] or report to the nearest emergency room.  7. Do not stay seated continuously for more than 2-3 hours for a day or two  after the procedure.  Tighten your buttock muscles 10-15 times every two hours and take 10-15 deep breaths every 1-2 hours.  Do not spend more than a few minutes on the toilet if you cannot empty your bowel; instead re-visit the toilet at a later time.   Stay on benefiber

## 2020-01-30 ENCOUNTER — Telehealth: Payer: Self-pay | Admitting: Gastroenterology

## 2020-01-30 NOTE — Telephone Encounter (Signed)
Spoke with the patient. States understanding of these instructions.

## 2020-01-30 NOTE — Telephone Encounter (Signed)
If the pain is worse, he needs to come to the hospital.  He can use Anusol suppository twice daily X3 days. Sitz bath or warm soaking bath three times daily.  Benefiber TID with meals.

## 2020-01-30 NOTE — Telephone Encounter (Signed)
Spoke with the patient. He was banded on 01/23/20. He states he has had "almost strangulated" hemorrhoids protruding from the rectum. He has used hemorrhoid cream without relief, so he used a suppository x 2 in the past days. He felt this helped. He says the pain has been to the intensity that he considered the ED. Seems sure of the typical self care measures and does not feel they are working.

## 2020-02-17 ENCOUNTER — Other Ambulatory Visit: Payer: Self-pay

## 2020-02-17 ENCOUNTER — Encounter: Payer: Self-pay | Admitting: Family Medicine

## 2020-02-17 ENCOUNTER — Ambulatory Visit: Payer: BC Managed Care – PPO | Admitting: Family Medicine

## 2020-02-17 DIAGNOSIS — S83241D Other tear of medial meniscus, current injury, right knee, subsequent encounter: Secondary | ICD-10-CM

## 2020-02-17 NOTE — Assessment & Plan Note (Signed)
Repeat injection given today.  Has been 6 months since his injury.  I believe the patient will do very well.  Topical anti-inflammatories, icing regimen, patient is to do more bracing.  Patient did have a small effusion today and if continuing to have an effusion I would like patient to come back and will consider the possibility of advanced imaging I am hoping the patient will do well with conservative therapy.  Chronic problem with exacerbation and discussed medication management including the topical anti-inflammatories that has been prescribed.

## 2020-02-17 NOTE — Patient Instructions (Addendum)
Good to see you Voltaren gel over the counter if pennsaid runs out Restart tart cherry Use pennsaid 2 times a day for the next 10 days See me again in 2 months

## 2020-02-17 NOTE — Progress Notes (Signed)
[facility] [address] [place] Phone: [phone] Subjective:   I James [name] am serving as a Education administrator for Dr. Hulan Saas.  This visit occurred during the SARS-CoV-2 public health emergency.  Safety protocols were in place, including screening questions prior to the visit, additional usage of staff PPE, and extensive cleaning of exam room while observing appropriate contact time as indicated for disinfecting solutions.   I'm seeing this patient by the request  of:  Laurey Morale, MD  CC: Knee pain follow-up  ERD:[id number]   12/09/2019 Doing well overall, discuss HEP, discussed which activities to do and which ones to avoid. RTC in 8 weeks   Update 02/17/2020 James [name] is a 69 y.o. male coming in with complaint of right knee medial meniscus tear. Patient states he had a setback last week. Doing some yard work lately that strained the knee.  Patient feels like the yard work probably did cause some exacerbation of it.  Patient denies any numbness tingling.     Past Medical History:  Diagnosis Date  . Anterior basement membrane dystrophy    sees Dr. Vevelyn Royals   . Asthma    as a child, resolved   . Cataract    removed 2016  . Osteoarthritis    pt states it was misdiagnosed as Rheumatoid   . 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 CATARACT EXTRACTION, BILATERAL     per Dr. Tommy Rainwater   . EYE SURGERY Left    laser procedure  . sqamous cell     right eyebrow excised   . SUPERFICIAL KERATECTOMY Right 2015   per Dr. Lucita Ferrara    Social History   Socioeconomic History  . Marital status: Married    Spouse name: Not on file  . Number of children: Not on file  . Years of education: Not on file  . Highest education level: Not on file  Occupational History    . Not on file  Tobacco Use  . Smoking status: Former Smoker    Packs/day: 0.25    Types: Cigarettes  . Smokeless tobacco: Never Used  . Tobacco comment: quit on January 1st  Vaping Use  . Vaping Use: Never used  Substance and Sexual Activity  . Alcohol use: Yes    Alcohol/week: 0.0 standard drinks    Comment: rare  . Drug use: No  . Sexual activity: Not on file  Other Topics Concern  . Not on file  Social History Narrative  . Not on file   Social Determinants of Health   Financial Resource Strain:   . Difficulty of Paying Living Expenses:   Food Insecurity:   . Worried About Charity fundraiser in the Last Year:   . Arboriculturist in the Last Year:   Transportation Needs:   . Film/video editor (Medical):   Marland Kitchen Lack of Transportation (Non-Medical):   Physical Activity:   . Days of Exercise per Week:   . Minutes of Exercise per Session:   Stress:   . Feeling of Stress :   Social Connections:   . Frequency of Communication with Friends and Family:   . Frequency of Social Gatherings with Friends and Family:   . Attends Religious Services:   .  Active Member of Clubs or Organizations:   . Attends Archivist Meetings:   Marland Kitchen Marital Status:    Allergies  Allergen Reactions  . Penicillins     Respiratory reaction SOB   Family History  Problem Relation Age of Onset  . Uterine cancer Mother   . Breast cancer Mother   . Colon cancer Neg Hx   . Colon polyps Neg Hx   . Esophageal cancer Neg Hx   . Rectal cancer Neg Hx   . Stomach cancer Neg Hx       Current Outpatient Medications (Respiratory):  Marland Kitchen  Albuterol Sulfate 108 (90 Base) MCG/ACT AEPB, Inhale 2 puffs into the lungs every 4 (four) hours as needed (SOB or wheezing).    Current Outpatient Medications (Other):  .  cholecalciferol (VITAMIN D) 1000 units tablet, Take 1,000 Units by mouth daily. .  Diclofenac Sodium 2 % SOLN, Place 2 g onto the skin 2 (two) times daily. (Patient taking differently:  Place 2 g onto the skin as needed. ) .  FIBER PO, Take by mouth daily. Organic fiber .  Garlic 10 MG CAPS, Take by mouth daily. Marland Kitchen  glucosamine-chondroitin 500-400 MG tablet, Take 2 tablets by mouth. .  hydrocortisone (ANUSOL-HC) 25 MG suppository, Place 1 suppository (25 mg total) rectally at bedtime as needed for hemorrhoids or anal itching. .  NON FORMULARY, Takes organic vitamins .  OVER THE COUNTER MEDICATION, at bedtime. Tarte cherry .  Saw Palmetto, Serenoa repens, (SAW PALMETTO PO), Take 320 mg by mouth daily. .  TURMERIC PO, Take 1,000 mg by mouth daily.   Reviewed prior external information including notes and imaging from  primary care provider As well as notes that were available from care everywhere and other healthcare systems.  Past medical history, social, surgical and family history all reviewed in electronic medical record.  No pertanent information unless stated regarding to the chief complaint.   Review of Systems:  No headache, visual changes, nausea, vomiting, diarrhea, constipation, dizziness, abdominal pain, skin rash, fevers, chills, night sweats, weight loss, swollen lymph nodes, body aches, joint swelling, chest pain, shortness of breath, mood changes. POSITIVE muscle aches  Objective  Blood pressure (!) 142/82, pulse 82, height 5\' 10"  (1.778 m), weight 212 lb (96.2 kg), SpO2 97 %.   General: No apparent distress alert and oriented x3 mood and affect normal, dressed appropriately.  HEENT: Pupils equal, extraocular movements intact  Respiratory: Patient's speak in full sentences and does not appear short of breath  Cardiovascular: No lower extremity edema, non tender, no erythema  Neuro: Cranial nerves II through XII are intact, neurovascularly intact in all extremities with 2+ DTRs and 2+ pulses.  Gait mild antalgic  Right knee does have an effusion noted.  Lacks last 10 degrees of flexion.  Still positive McMurray's noted.  Mild crepitus noted.  After  informed written and verbal consent, patient was seated on exam table. Right knee was prepped with alcohol swab and utilizing anterolateral approach, patient's right knee space was injected with 4:1  marcaine 0.5%: Kenalog 40mg /dL. Patient tolerated the procedure well without immediate complications.    Impression and Recommendations:     The above documentation has been reviewed and is accurate and complete Lyndal Pulley, DO       Note: This dictation was prepared with Dragon dictation along with smaller phrase technology. Any transcriptional errors that result from this process are unintentional.

## 2020-03-25 ENCOUNTER — Ambulatory Visit (INDEPENDENT_AMBULATORY_CARE_PROVIDER_SITE_OTHER): Payer: BC Managed Care – PPO

## 2020-03-25 ENCOUNTER — Encounter: Payer: Self-pay | Admitting: Family Medicine

## 2020-03-25 ENCOUNTER — Ambulatory Visit (HOSPITAL_COMMUNITY)
Admission: RE | Admit: 2020-03-25 | Discharge: 2020-03-25 | Disposition: A | Discharge status: Home / Self Care | Payer: BC Managed Care – PPO | Source: Ambulatory Visit | Attending: Cardiovascular Disease | Admitting: Cardiovascular Disease

## 2020-03-25 ENCOUNTER — Ambulatory Visit: Payer: BC Managed Care – PPO | Admitting: Family Medicine

## 2020-03-25 ENCOUNTER — Other Ambulatory Visit: Payer: Self-pay

## 2020-03-25 ENCOUNTER — Ambulatory Visit: Payer: Self-pay

## 2020-03-25 VITALS — BP 130/98 | HR 75 | Ht 70.0 in | Wt 212.0 lb

## 2020-03-25 DIAGNOSIS — M545 Low back pain, unspecified: Secondary | ICD-10-CM

## 2020-03-25 DIAGNOSIS — M25562 Pain in left knee: Secondary | ICD-10-CM | POA: Diagnosis not present

## 2020-03-25 DIAGNOSIS — M47817 Spondylosis without myelopathy or radiculopathy, lumbosacral region: Secondary | ICD-10-CM | POA: Diagnosis not present

## 2020-03-25 DIAGNOSIS — M79605 Pain in left leg: Secondary | ICD-10-CM

## 2020-03-25 DIAGNOSIS — S83282A Other tear of lateral meniscus, current injury, left knee, initial encounter: Secondary | ICD-10-CM | POA: Diagnosis not present

## 2020-03-25 IMAGING — DX DG LUMBAR SPINE 2-3V
3 series · 3 of 3 positions shown · non-contrast
Comparison: None.

CLINICAL DATA: Lumbar pain

EXAM:
LUMBAR SPINE - 2-3 VIEW

[l-spine ap]
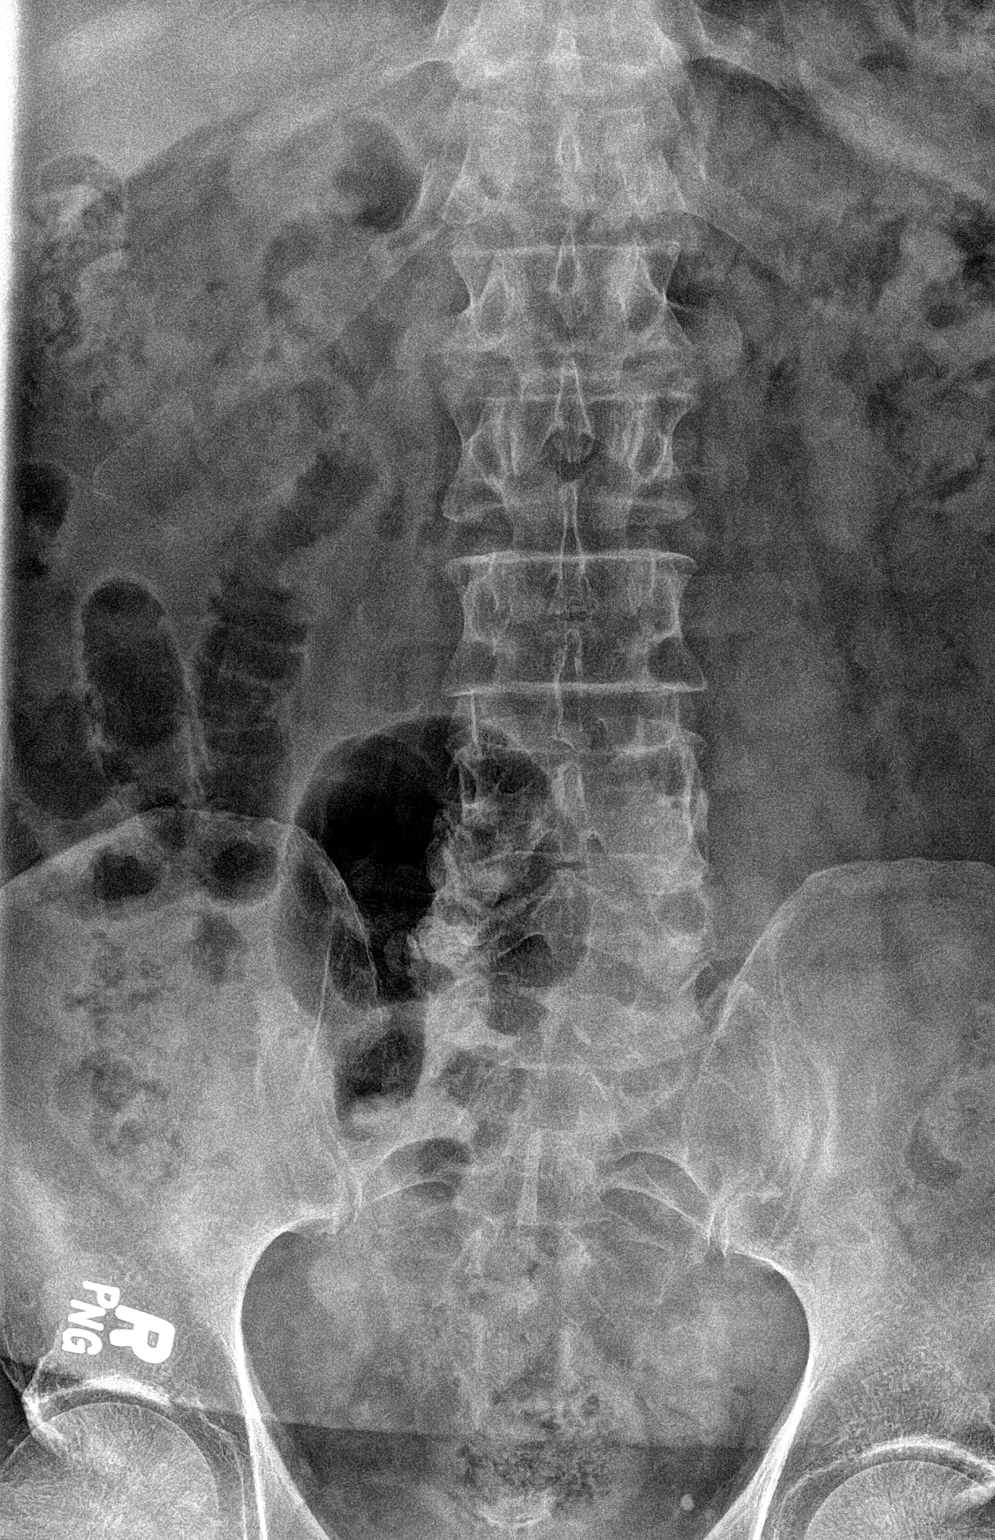

[l-spine lateral (1 of 2)]
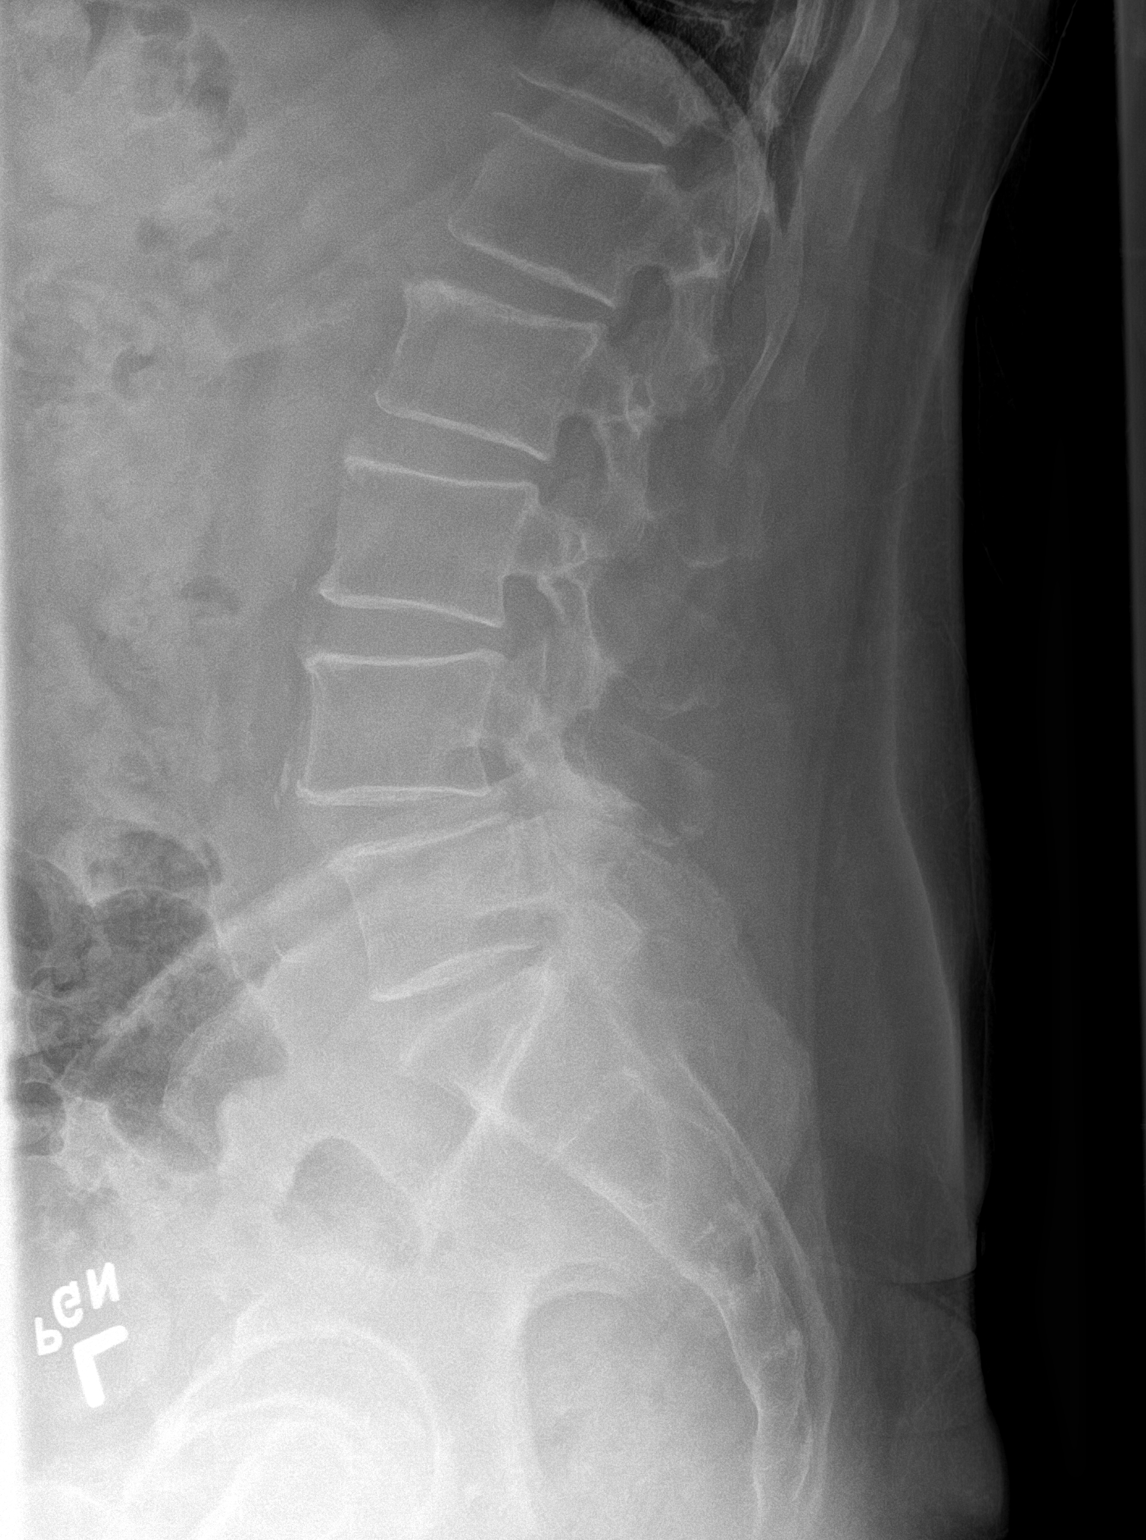

[l-spine lateral (2 of 2)]
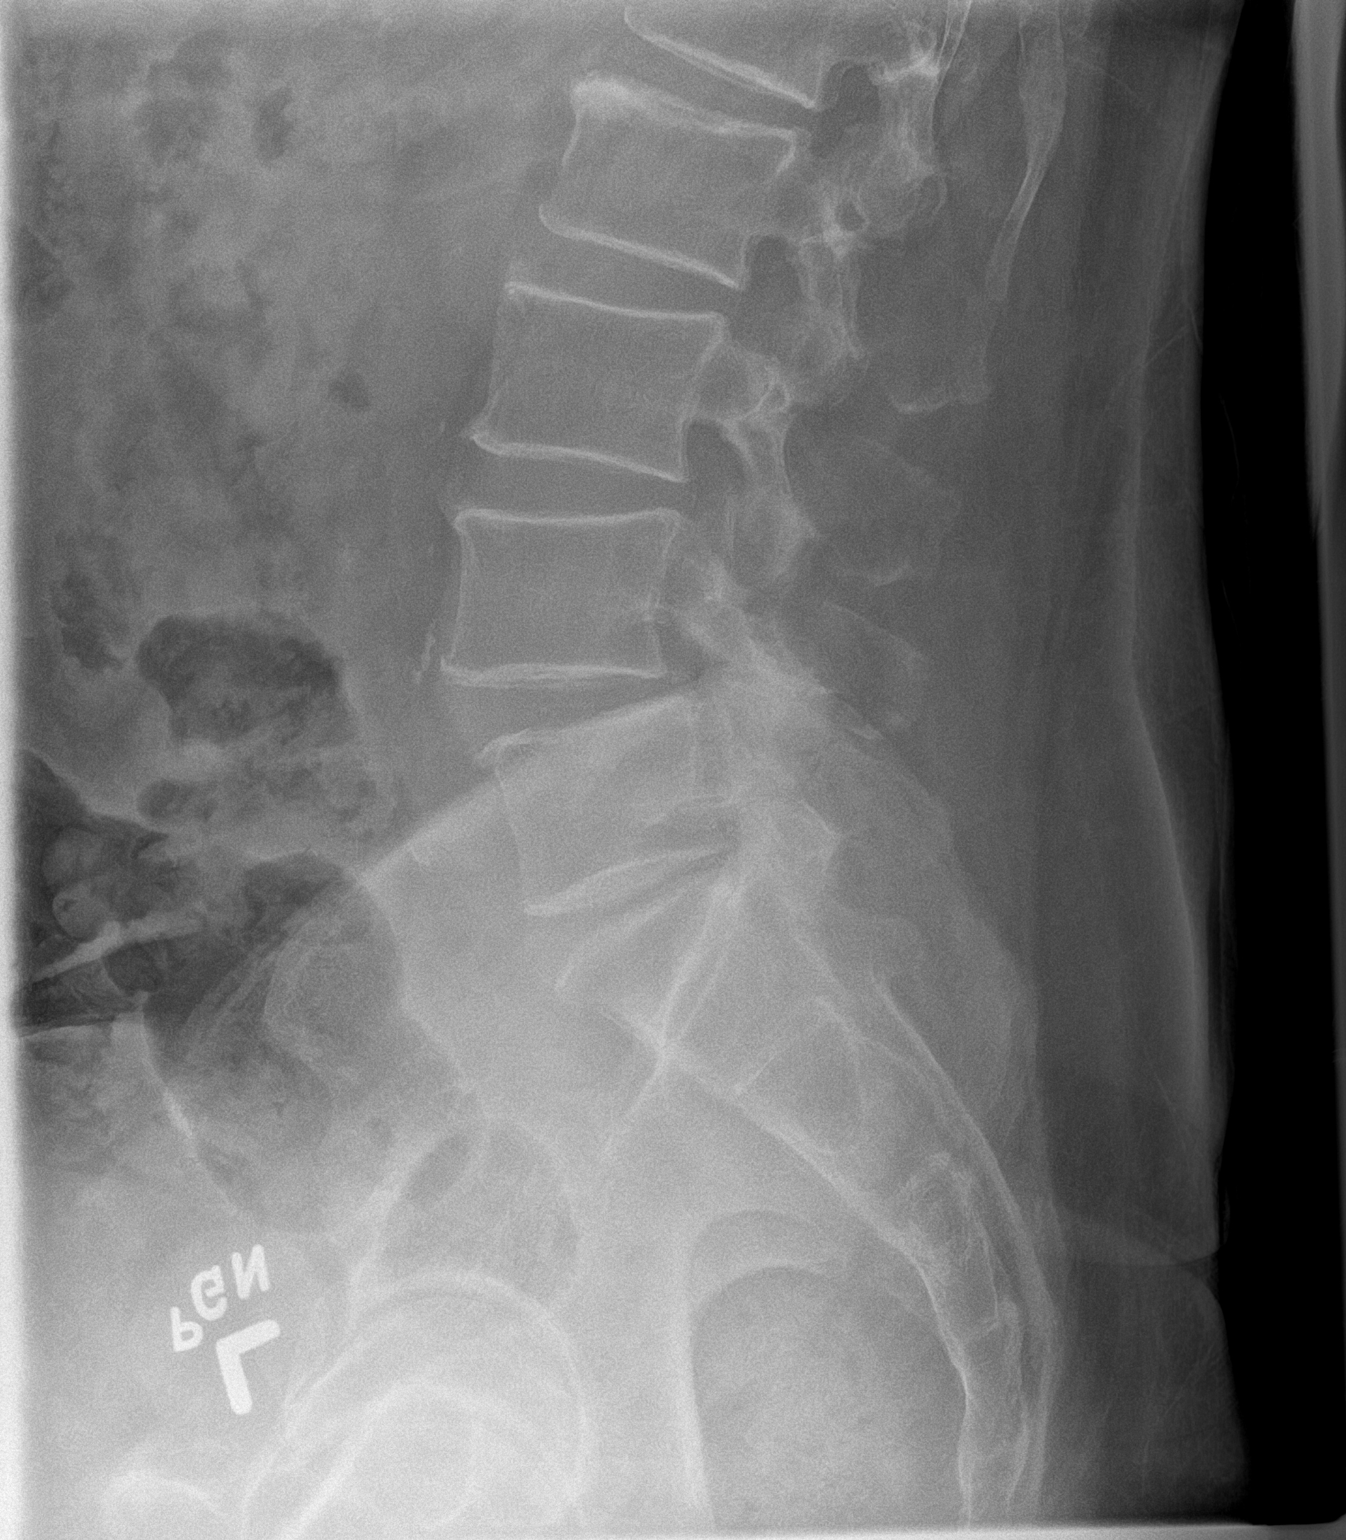

[3 of 3 positions shown; findings below may reference images not displayed]

FINDINGS: Grade 1 anterolisthesis L4 on L5. Mild disc space narrowing at L4-L5
and L5-S1. Vertebral body heights are maintained.
IMPRESSION: Grade 1 anterolisthesis L4 on L5 with mild degenerative change at
L4-L5 and L5-S1.

## 2020-03-25 NOTE — Progress Notes (Signed)
[place] Phone: [phone] Subjective:   [name], am serving as a scribe for Dr. Hulan Saas. This visit occurred during the SARS-CoV-2 public health emergency.  Safety protocols were in place, including screening questions prior to the visit, additional usage of staff PPE, and extensive cleaning of exam room while observing appropriate contact time as indicated for disinfecting solutions.   I'm seeing this patient by the request  of:  Laurey Morale, MD  CC: Left knee pain  [id number]   02/17/2020 Repeat injection given today.  Has been 6 months since his injury.  I believe the patient will do very well.  Topical anti-inflammatories, icing regimen, patient is to do more bracing.  Patient did have a small effusion today and if continuing to have an effusion I would like patient to come back and will consider the possibility of advanced imaging I am hoping the patient will do well with conservative therapy.  Chronic problem with exacerbation and discussed medication management including the topical anti-inflammatories that has been prescribed.  Update 03/25/2020 DAMARIA STOFKO is a 69 y.o. male coming in with complaint of right knee pain. Pain has improved.  Patient states his left knee and hip are now bothering him for past week. Pain radiating from lateral aspect of knee into his hip. Had been using Pennsaid and ice to help with pain. Yesterday he felt an increase with stair use. Was unable to put weight on that leg one he got to the top of the stairs. Notices his foot been drawn inward.  Patient states that it is having difficulty even bearing weight yesterday but now is a little bit better.      Past Medical History:  Diagnosis Date  . Anterior basement membrane dystrophy    sees Dr. Vevelyn Royals   . Asthma    as a child, resolved   . Cataract    removed 2016  . Osteoarthritis    pt  states it was misdiagnosed as Rheumatoid   . 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 CATARACT EXTRACTION, BILATERAL     per Dr. Tommy Rainwater   . EYE SURGERY Left    laser procedure  . sqamous cell     right eyebrow excised   . SUPERFICIAL KERATECTOMY Right 2015   per Dr. Lucita Ferrara    Social History   Socioeconomic History  . Marital status: Married    Spouse name: Not on file  . Number of children: Not on file  . Years of education: Not on file  . Highest education level: Not on file  Occupational History  . Not on file  Tobacco Use  . Smoking status: Former Smoker    Packs/day: 0.25    Types: Cigarettes  . Smokeless tobacco: Never Used  . Tobacco comment: quit on January 1st  Vaping Use  . Vaping Use: Never used  Substance and Sexual Activity  . Alcohol use: Yes    Alcohol/week: 0.0 standard drinks    Comment: rare  . Drug use: No  . Sexual activity: Not on file  Other Topics Concern  . Not on file  Social History Narrative  . Not on file   Social Determinants of Health   Financial Resource  Strain:   . Difficulty of Paying Living Expenses:   Food Insecurity:   . Worried About Charity fundraiser in the Last Year:   . Arboriculturist in the Last Year:   Transportation Needs:   . Film/video editor (Medical):   Marland Kitchen Lack of Transportation (Non-Medical):   Physical Activity:   . Days of Exercise per Week:   . Minutes of Exercise per Session:   Stress:   . Feeling of Stress :   Social Connections:   . Frequency of Communication with Friends and Family:   . Frequency of Social Gatherings with Friends and Family:   . Attends Religious Services:   . Active Member of Clubs or Organizations:   . Attends Archivist Meetings:   Marland Kitchen Marital Status:    Allergies  Allergen Reactions  . Penicillins      Respiratory reaction SOB   Family History  Problem Relation Age of Onset  . Uterine cancer Mother   . Breast cancer Mother   . Colon cancer Neg Hx   . Colon polyps Neg Hx   . Esophageal cancer Neg Hx   . Rectal cancer Neg Hx   . Stomach cancer Neg Hx       Current Outpatient Medications (Respiratory):  Marland Kitchen  Albuterol Sulfate 108 (90 Base) MCG/ACT AEPB, Inhale 2 puffs into the lungs every 4 (four) hours as needed (SOB or wheezing).    Current Outpatient Medications (Other):  .  cholecalciferol (VITAMIN D) 1000 units tablet, Take 1,000 Units by mouth daily. .  Diclofenac Sodium 2 % SOLN, Place 2 g onto the skin 2 (two) times daily. (Patient taking differently: Place 2 g onto the skin as needed. ) .  FIBER PO, Take by mouth daily. Organic fiber .  Garlic 10 MG CAPS, Take by mouth daily. Marland Kitchen  glucosamine-chondroitin 500-400 MG tablet, Take 2 tablets by mouth. .  hydrocortisone (ANUSOL-HC) 25 MG suppository, Place 1 suppository (25 mg total) rectally at bedtime as needed for hemorrhoids or anal itching. .  NON FORMULARY, Takes organic vitamins .  OVER THE COUNTER MEDICATION, at bedtime. Tarte cherry .  Saw Palmetto, Serenoa repens, (SAW PALMETTO PO), Take 320 mg by mouth daily. .  TURMERIC PO, Take 1,000 mg by mouth daily.   Reviewed prior external information including notes and imaging from  primary care provider As well as notes that were available from care everywhere and other healthcare systems.  Past medical history, social, surgical and family history all reviewed in electronic medical record.  No pertanent information unless stated regarding to the chief complaint.   Review of Systems:  No headache, visual changes, nausea, vomiting, diarrhea, constipation, dizziness, abdominal pain, skin rash, fevers, chills, night sweats, weight loss, swollen lymph nodes, body aches, joint swelling, chest pain, shortness of breath, mood changes. POSITIVE muscle aches  Objective  Blood  pressure (!) 130/98, pulse 75, height 5\' 10"  (1.778 m), weight (!) 212 lb (96.2 kg), SpO2 97 %.   General: No apparent distress alert and oriented x3 mood and affect normal, dressed appropriately.  HEENT: Pupils equal, extraocular movements intact  Respiratory: Patient's speak in full sentences and does not appear short of breath  Cardiovascular: No lower extremity edema, non tender, no erythema  Neuro: Cranial nerves II through XII are intact, neurovascularly intact in all extremities with 2+ DTRs and 2+ pulses.  Gait severely antalgic and guarded Left knee exam does show an effusion noted.  Only has flexion  to 95 degrees.  Lots of voluntary guarding noted.  Tender to palpation over the medial joint space.  Positive McMurray's.  Does have pain with [place] Silos test though.  Achilles is intact with pain in the calf  Limited musculoskeletal ultrasound was performed and interpreted by Lyndal Pulley  Limited ultrasound of patient's knee shows an effusion noted in acute tear of the medial meniscus noted with very minimal displacement noted.  Patient does have mild to moderate arthritic changes of all 3 compartments.  Difficulty completely compressing venous vasculature posteriorly.  After informed written and verbal consent, patient was seated on exam table. Left knee was prepped with alcohol swab and utilizing anterolateral approach, patient's left knee space was injected with 4:1  marcaine 0.5%: Kenalog 40mg /dL. Patient tolerated the procedure well without immediate complications.    Impression and Recommendations:     The above documentation has been reviewed and is accurate and complete Lyndal Pulley, DO       Note: This dictation was prepared with Dragon dictation along with smaller phrase technology. Any transcriptional errors that result from this process are unintentional.

## 2020-03-25 NOTE — Patient Instructions (Addendum)
Xray today [facility] [phone] 2 baby aspirin daily until test See me on August 9th at 1:00pm

## 2020-03-25 NOTE — Assessment & Plan Note (Signed)
Patient had what appeared to be more of an acute injury.  Does have swelling of the knee though noted.  Mild to moderate arthritic changes.  Patient unable to truly compress the vasculature so we will get a Doppler but likely will be negative.  Responded well to the injection.  He has had a meniscus tear on the contralateral side and likely will do well with conservative therapy.  Follow-up with me again in 1 week's time

## 2020-04-05 ENCOUNTER — Encounter: Payer: Self-pay | Admitting: Family Medicine

## 2020-04-05 ENCOUNTER — Ambulatory Visit: Payer: BC Managed Care – PPO | Admitting: Family Medicine

## 2020-04-05 ENCOUNTER — Ambulatory Visit (INDEPENDENT_AMBULATORY_CARE_PROVIDER_SITE_OTHER): Payer: BC Managed Care – PPO

## 2020-04-05 ENCOUNTER — Other Ambulatory Visit: Payer: Self-pay

## 2020-04-05 VITALS — BP 150/90 | HR 83 | Ht 70.0 in | Wt 210.0 lb

## 2020-04-05 DIAGNOSIS — S83282A Other tear of lateral meniscus, current injury, left knee, initial encounter: Secondary | ICD-10-CM | POA: Diagnosis not present

## 2020-04-05 DIAGNOSIS — M25562 Pain in left knee: Secondary | ICD-10-CM

## 2020-04-05 DIAGNOSIS — G8929 Other chronic pain: Secondary | ICD-10-CM | POA: Diagnosis not present

## 2020-04-05 IMAGING — DX DG KNEE COMPLETE 4+V*L*
4 series · 4 of 4 positions shown · non-contrast
Comparison: None.

CLINICAL DATA: Left knee pain

EXAM:
LEFT KNEE - COMPLETE 4+ VIEW

[knee ap]
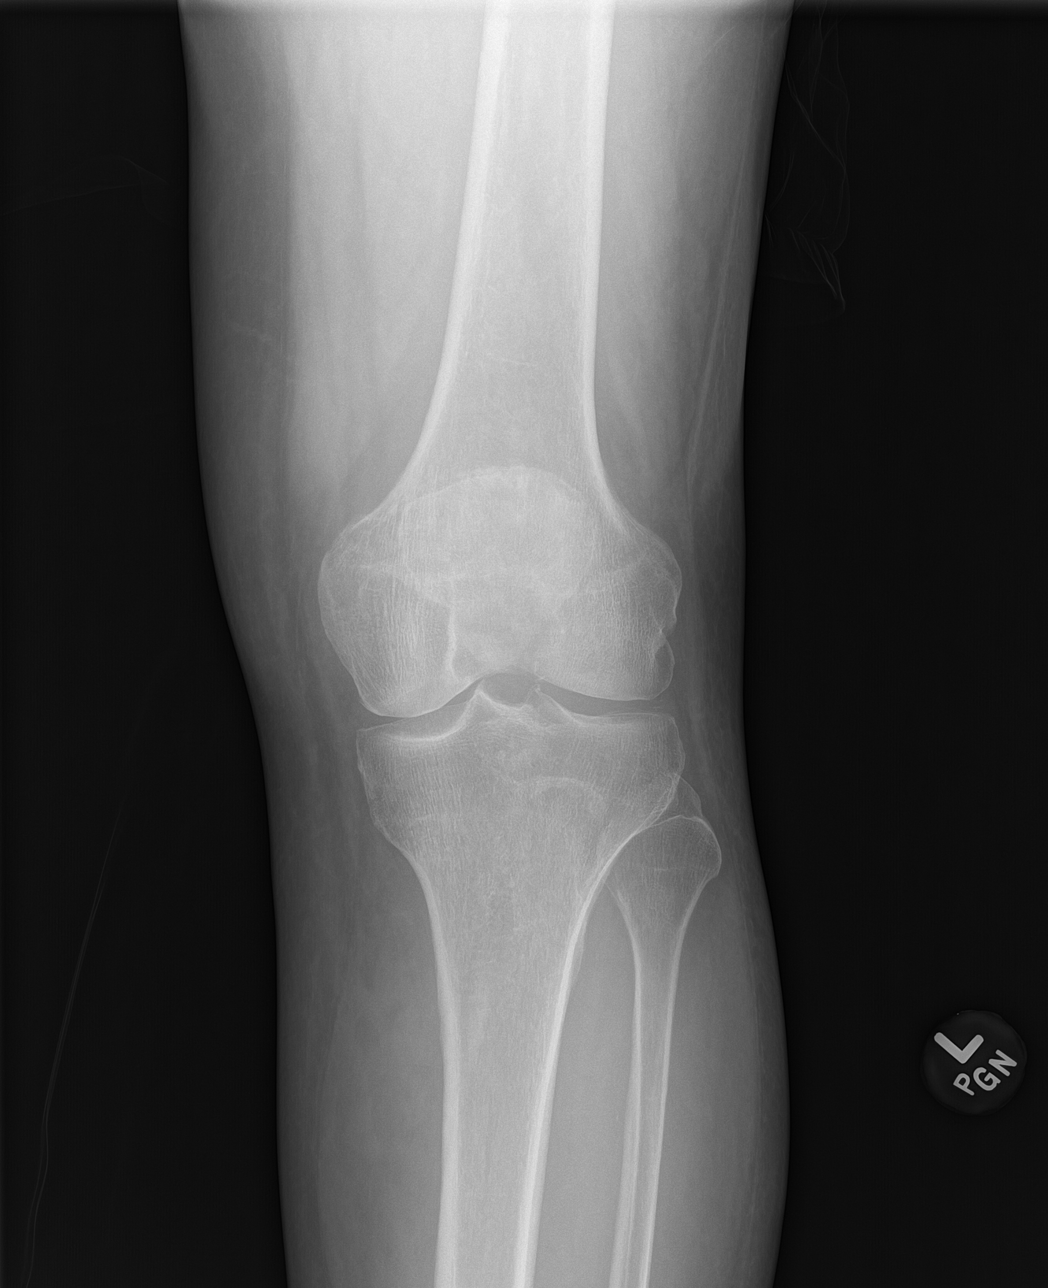

[knee obl (1 of 2)]
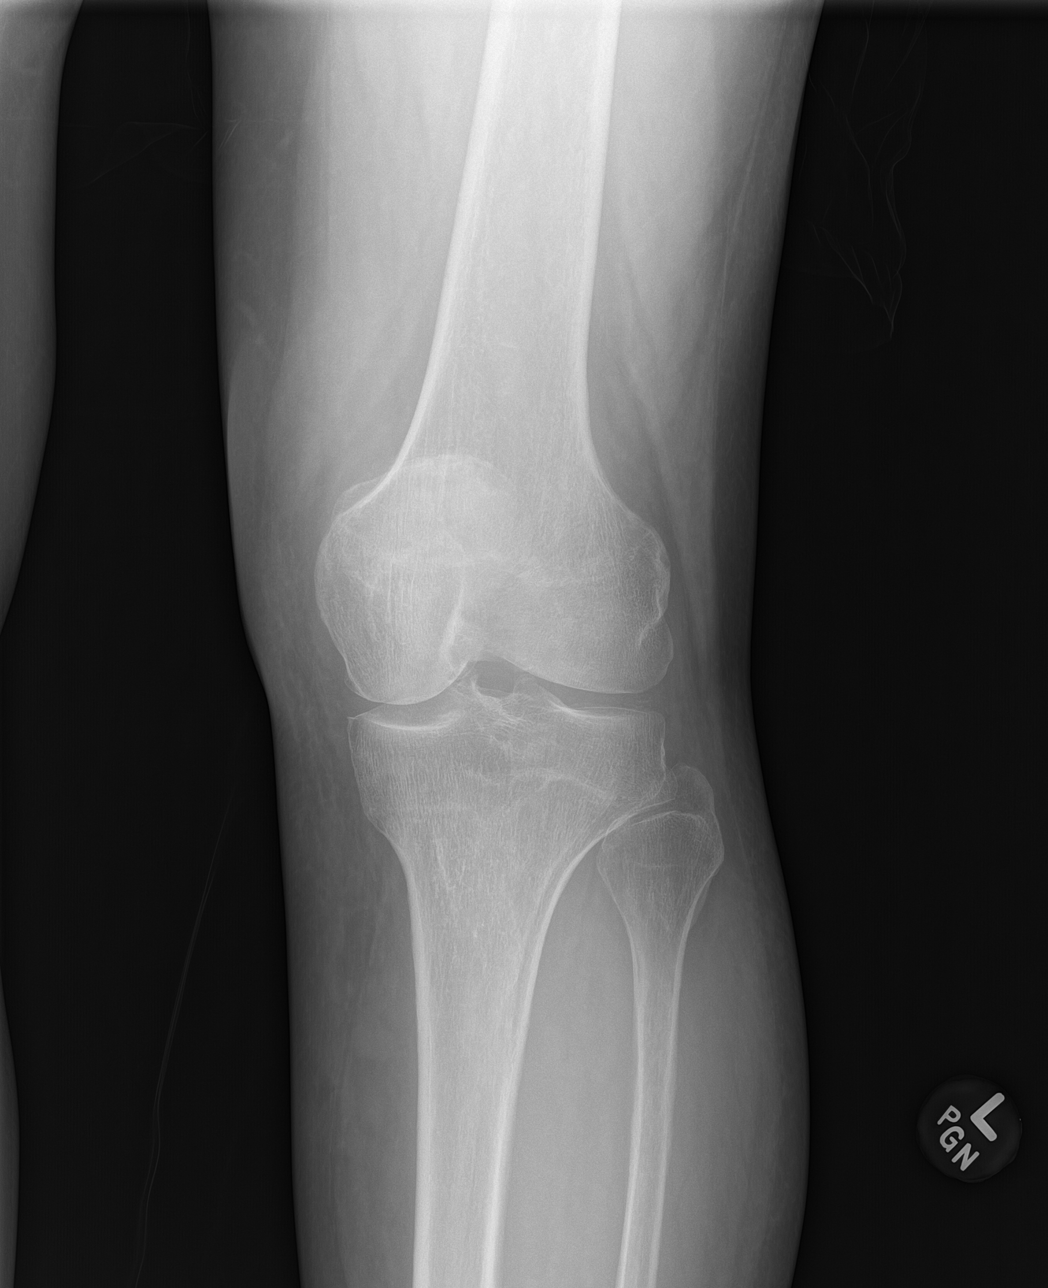

[knee obl (2 of 2)]
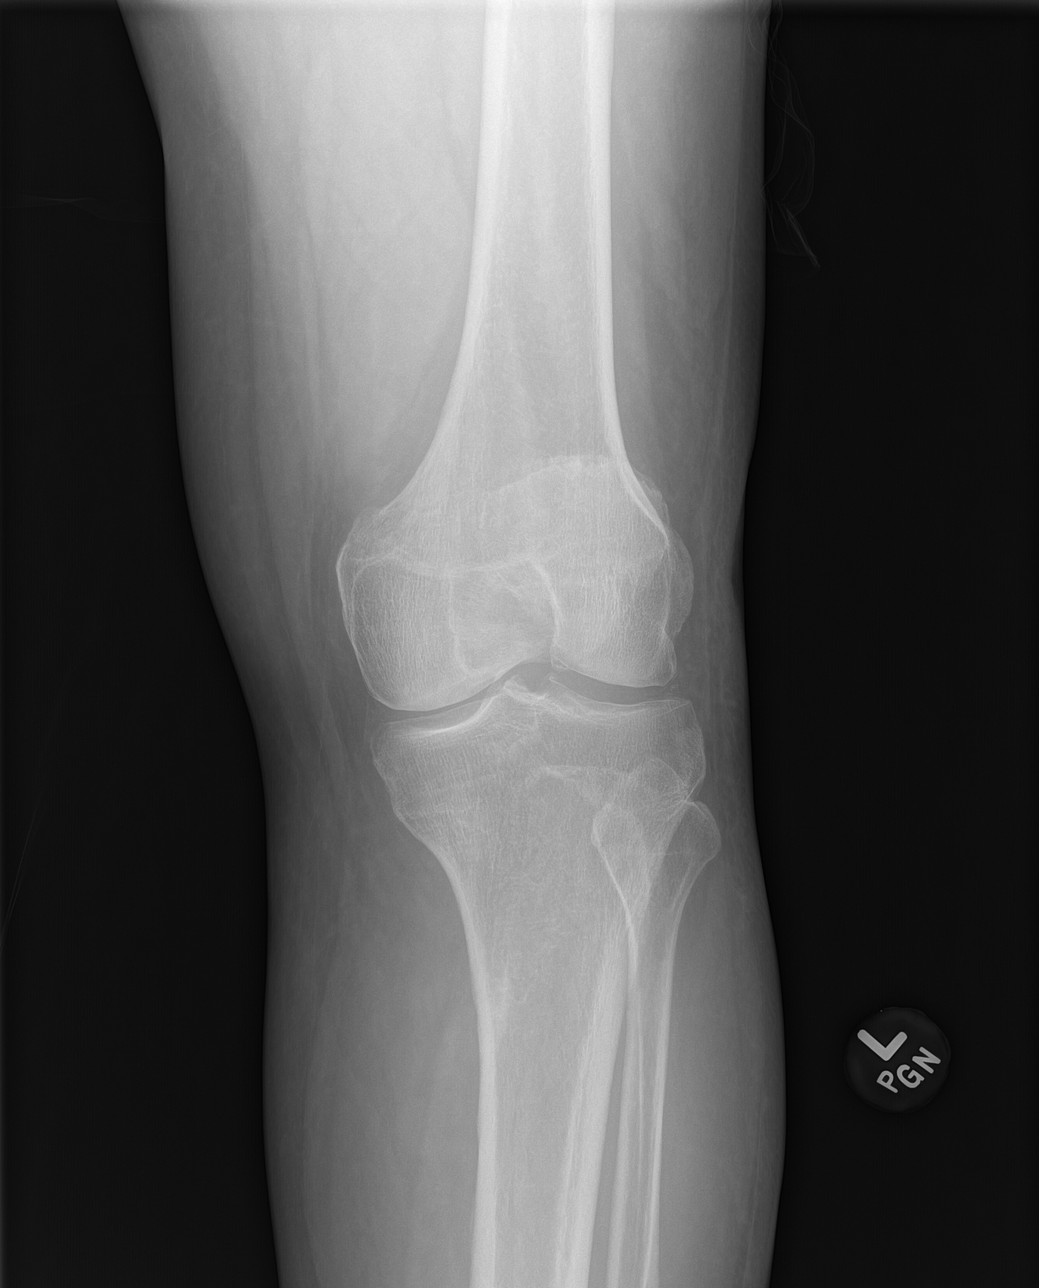

[knee lat]
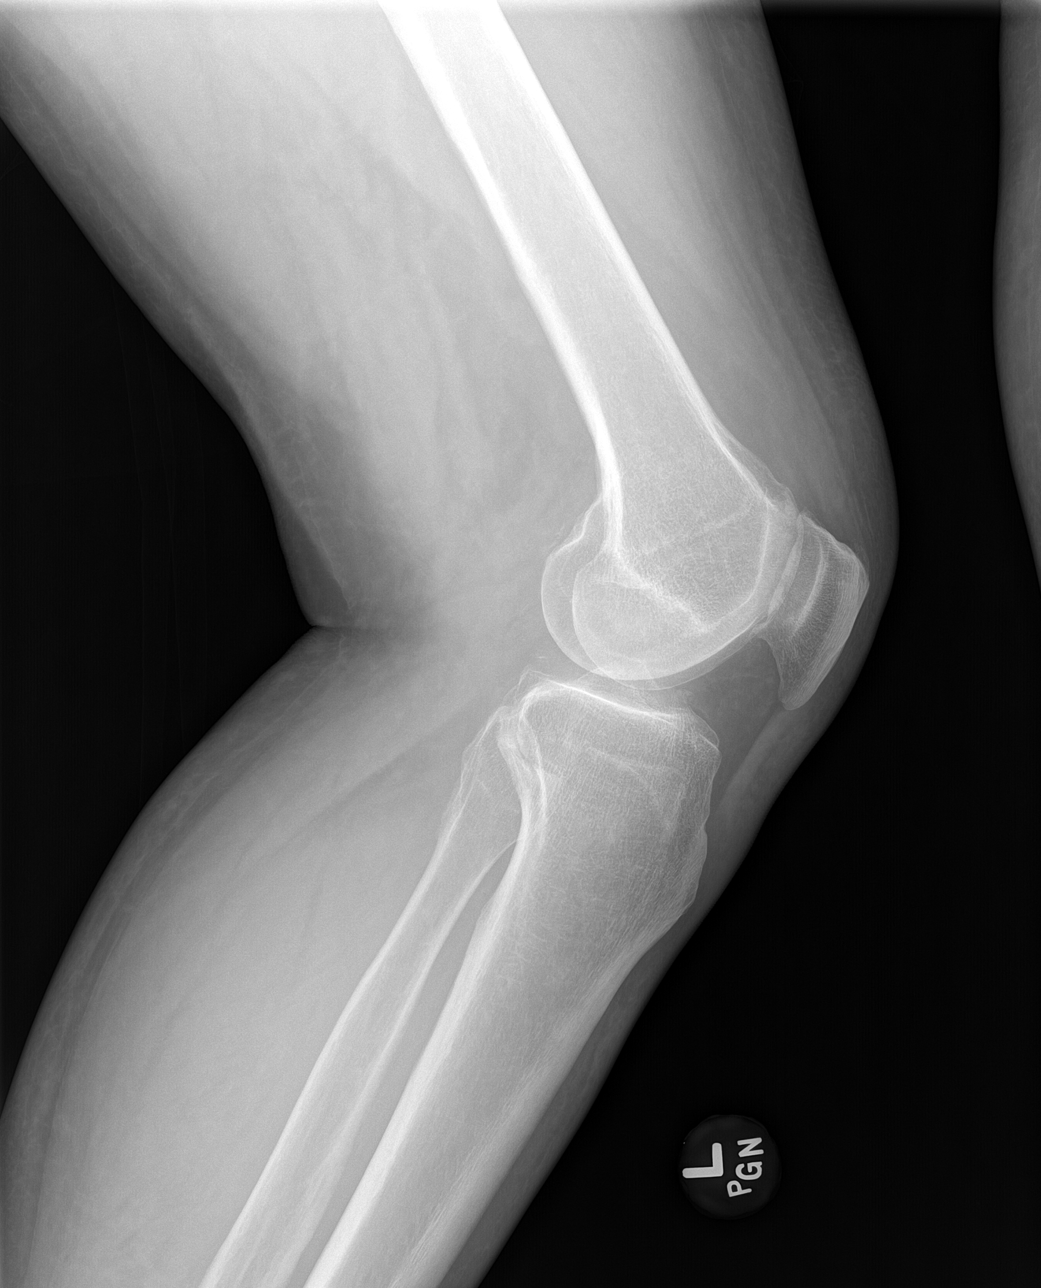

[4 of 4 positions shown; findings below may reference images not displayed]

FINDINGS: Four view radiograph left knee demonstrates normal alignment. No
fracture or dislocation. Medial and lateral compartment joint spaces
appear preserved. No effusion. Soft tissues are unremarkable.
IMPRESSION: Negative.

## 2020-04-05 NOTE — Assessment & Plan Note (Signed)
Patient is a large meniscal tear previously known attempted injection but still having instability and locking of the knee from time to time.  I believe that advanced imaging is warranted with an MRI.  Patient would be a surgical candidate if necessary.  X-rays pending to rule out any occult fracture but I think it is highly unlikely.  Follow-up with me again after imaging and will discuss

## 2020-04-05 NOTE — Patient Instructions (Addendum)
Good to see you Left knee xray Mri ordered Will talk about MRI after for next steps

## 2020-04-05 NOTE — Progress Notes (Signed)
[place] [address] [place] Phone: [phone] Subjective:   I James [name] am serving as a Education administrator for Dr. Hulan Saas.  This visit occurred during the SARS-CoV-2 public health emergency.  Safety protocols were in place, including screening questions prior to the visit, additional usage of staff PPE, and extensive cleaning of exam room while observing appropriate contact time as indicated for disinfecting solutions.   I'm seeing this patient by the request  of:  James Morale, MD  CC: Left knee pain follow-up  [id number]   03/25/2020 Patient had what appeared to be more of an acute injury.  Does have swelling of the knee though noted.  Mild to moderate arthritic changes.  Patient unable to truly compress the vasculature so we will get a Doppler but likely will be negative.  Responded well to the injection.  He has had a meniscus tear on the contralateral side and likely will do well with conservative therapy.  Follow-up with me again in 1 week's time  Update 04/05/2020 James [name] is a 69 y.o. male coming in with complaint of left knee pain. Patient states he is still in pain. Pain got a little better than last visit but states he has been using his knee more recently. Still not right  Still need walker a decent amount, injection help.        Past Medical History:  Diagnosis Date  . Anterior basement membrane dystrophy    sees Dr. Vevelyn Royals   . Asthma    as a child, resolved   . Cataract    removed 2016  . Osteoarthritis    pt states it was misdiagnosed as Rheumatoid   . 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 CATARACT EXTRACTION, BILATERAL     per Dr. Tommy Rainwater   . EYE SURGERY Left    laser procedure  . sqamous cell     right eyebrow excised   .  SUPERFICIAL KERATECTOMY Right 2015   per Dr. Lucita Ferrara    Social History   Socioeconomic History  . Marital status: Married    Spouse name: Not on file  . Number of children: Not on file  . Years of education: Not on file  . Highest education level: Not on file  Occupational History  . Not on file  Tobacco Use  . Smoking status: Former Smoker    Packs/day: 0.25    Types: Cigarettes  . Smokeless tobacco: Never Used  . Tobacco comment: quit on January 1st  Vaping Use  . Vaping Use: Never used  Substance and Sexual Activity  . Alcohol use: Yes    Alcohol/week: 0.0 standard drinks    Comment: rare  . Drug use: No  . Sexual activity: Not on file  Other Topics Concern  . Not on file  Social History Narrative  . Not on file   Social Determinants of Health   Financial Resource Strain:   . Difficulty of Paying Living Expenses:   Food Insecurity:   . Worried About Charity fundraiser in the Last Year:   . Arboriculturist in the Last Year:   Transportation Needs:   . Film/video editor (Medical):   Marland Kitchen Lack of Transportation (Non-Medical):   Physical  Activity:   . Days of Exercise per Week:   . Minutes of Exercise per Session:   Stress:   . Feeling of Stress :   Social Connections:   . Frequency of Communication with Friends and Family:   . Frequency of Social Gatherings with Friends and Family:   . Attends Religious Services:   . Active Member of Clubs or Organizations:   . Attends Archivist Meetings:   Marland Kitchen Marital Status:    Allergies  Allergen Reactions  . Penicillins     Respiratory reaction SOB   Family History  Problem Relation Age of Onset  . Uterine cancer Mother   . Breast cancer Mother   . Colon cancer Neg Hx   . Colon polyps Neg Hx   . Esophageal cancer Neg Hx   . Rectal cancer Neg Hx   . Stomach cancer Neg Hx       Current Outpatient Medications (Respiratory):  Marland Kitchen  Albuterol Sulfate 108 (90 Base) MCG/ACT AEPB, Inhale 2 puffs into  the lungs every 4 (four) hours as needed (SOB or wheezing).    Current Outpatient Medications (Other):  .  cholecalciferol (VITAMIN D) 1000 units tablet, Take 1,000 Units by mouth daily. .  Diclofenac Sodium 2 % SOLN, Place 2 g onto the skin 2 (two) times daily. (Patient taking differently: Place 2 g onto the skin as needed. ) .  FIBER PO, Take by mouth daily. Organic fiber .  Garlic 10 MG CAPS, Take by mouth daily. Marland Kitchen  glucosamine-chondroitin 500-400 MG tablet, Take 2 tablets by mouth. .  hydrocortisone (ANUSOL-HC) 25 MG suppository, Place 1 suppository (25 mg total) rectally at bedtime as needed for hemorrhoids or anal itching. .  NON FORMULARY, Takes organic vitamins .  OVER THE COUNTER MEDICATION, at bedtime. Tarte cherry .  Saw Palmetto, Serenoa repens, (SAW PALMETTO PO), Take 320 mg by mouth daily. .  TURMERIC PO, Take 1,000 mg by mouth daily.   Reviewed prior external information including notes and imaging from  primary care provider As well as notes that were available from care everywhere and other healthcare systems.  Past medical history, social, surgical and family history all reviewed in electronic medical record.  No pertanent information unless stated regarding to the chief complaint.   Review of Systems:  No headache, visual changes, nausea, vomiting, diarrhea, constipation, dizziness, abdominal pain, skin rash, fevers, chills, night sweats, weight loss, swollen lymph nodes, body aches, , chest pain, shortness of breath, mood changes. POSITIVE muscle aches  Objective  Blood pressure (!) 150/90, pulse 83, height 5\' 10"  (1.778 m), weight 210 lb (95.3 kg), SpO2 96 %.   General: No apparent distress alert and oriented x3 mood and affect normal, dressed appropriately.  HEENT: Pupils equal, extraocular movements intact  Respiratory: Patient's speak in full sentences and does not appear short of breath  Cardiovascular: Trace lower extremity edema, non tender, no erythema    Gait using walker very antalgic MSK: Left knee exam does have an effusion noted.  Lacking the last 10 degrees of flexion.  Some tenderness around the patellofemoral joint and the medial joint space.  Positive McMurray's that is severe.    Impression and Recommendations:     The above documentation has been reviewed and is accurate and complete Lyndal Pulley, DO       Note: This dictation was prepared with Dragon dictation along with smaller phrase technology. Any transcriptional errors that result from this process are unintentional.

## 2020-04-07 ENCOUNTER — Telehealth: Payer: Self-pay | Admitting: Family Medicine

## 2020-04-07 NOTE — Telephone Encounter (Signed)
Pt called to advise that MRI is scheduled for 9/1.  Should he schedule a follow up for after the MRI or wait until it results?  Does he need the appt he has with Tamala Julian on 8/24?

## 2020-04-12 NOTE — Telephone Encounter (Signed)
Left VM for pt to check MyChart for response.

## 2020-04-20 ENCOUNTER — Ambulatory Visit: Payer: BC Managed Care – PPO | Admitting: Family Medicine

## 2020-04-28 ENCOUNTER — Other Ambulatory Visit: Payer: Self-pay

## 2020-04-28 ENCOUNTER — Ambulatory Visit
Admission: RE | Admit: 2020-04-28 | Discharge: 2020-04-28 | Disposition: A | Discharge status: Home / Self Care | Payer: BC Managed Care – PPO | Source: Ambulatory Visit | Attending: Family Medicine | Admitting: Family Medicine

## 2020-04-28 DIAGNOSIS — M25562 Pain in left knee: Secondary | ICD-10-CM | POA: Diagnosis not present

## 2020-04-28 DIAGNOSIS — G8929 Other chronic pain: Secondary | ICD-10-CM

## 2020-04-28 IMAGING — MR MR KNEE*L* W/O CM
6 series · 40 of 40 positions shown · non-contrast
Comparison: Radiographs of [DATE]

CLINICAL DATA: Left knee pain over the last several years.

EXAM:
MRI OF THE LEFT KNEE WITHOUT CONTRAST
TECHNIQUE: Multiplanar, multisequence MR imaging of the knee was performed. No
intravenous contrast was administered.

[Series 6: T2 fat-sat · axial · left · 4.0mm · 0.50mm/px · z∈[-41,+113]mm · 7 of 36 slices shown (1 of 3)]
[im 1/36]
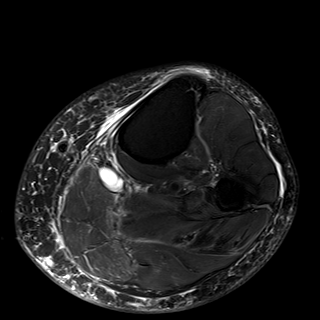
[im 6/36]
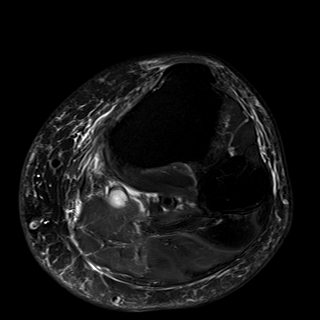
[im 12/36]
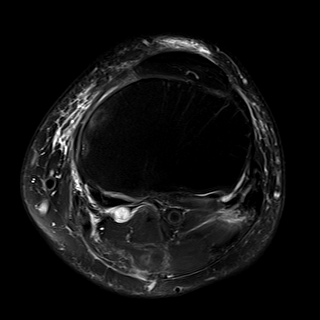
[im 18/36]
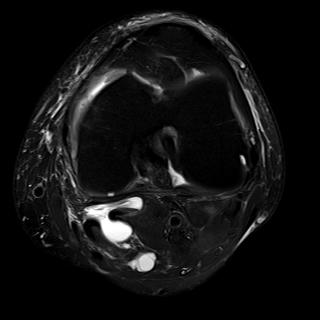
[im 24/36]
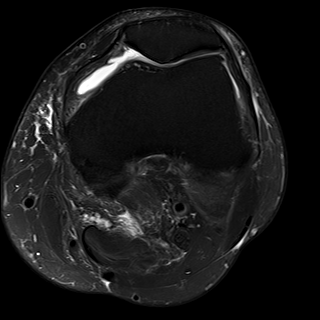
[im 30/36]
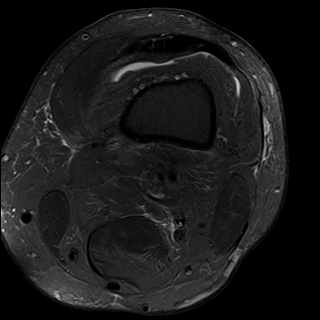
[im 36/36]
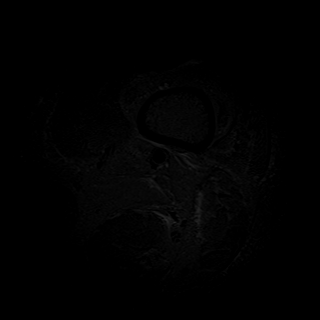

[Series 7: T2 fat-sat · coronal · left · 4.0mm · 0.39mm/px · 6 of 28 slices shown (2 of 3)]
[im 1/28]
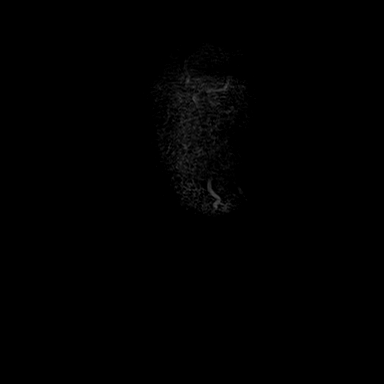
[im 6/28]
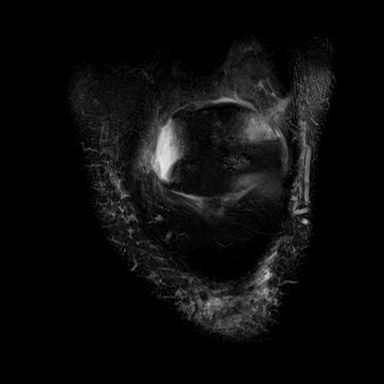
[im 11/28]
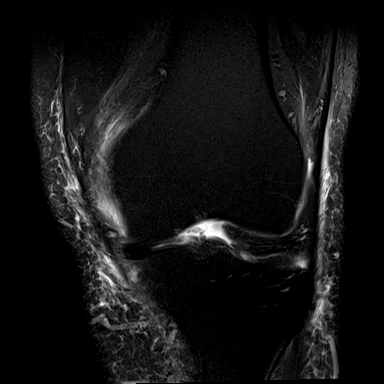
[im 17/28]
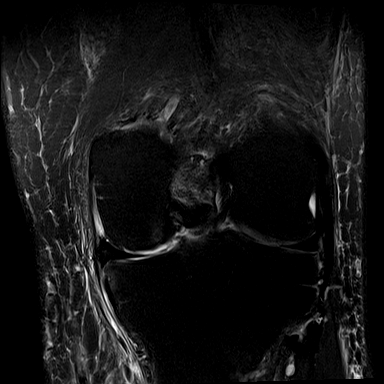
[im 22/28]
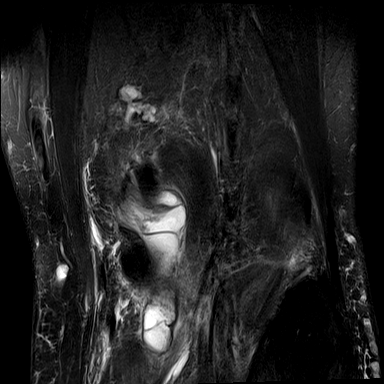
[im 28/28]
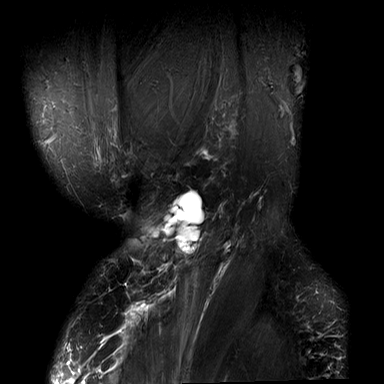

[Series 8: T1 · coronal · left · 4.0mm · 0.39mm/px · 6 of 28 slices shown]
[im 1/28]
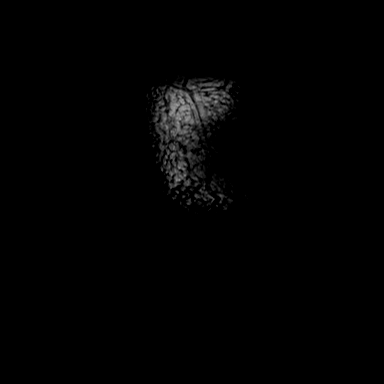
[im 6/28]
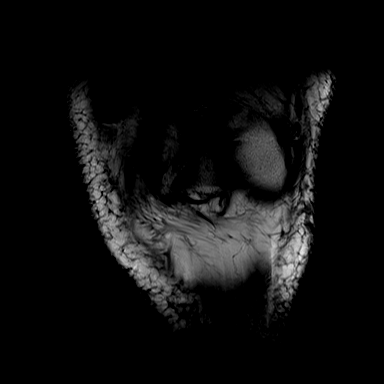
[im 11/28]
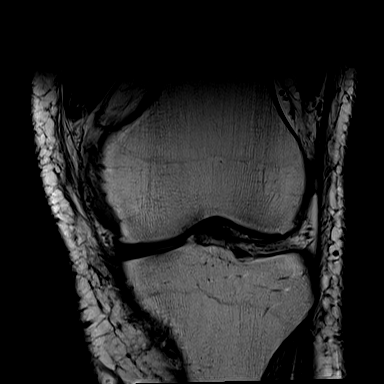
[im 17/28]
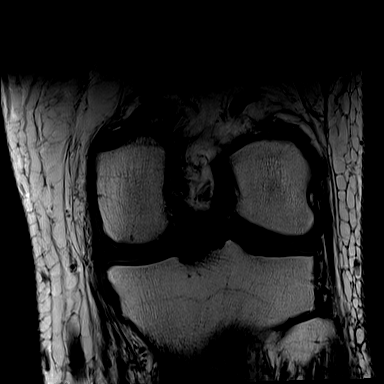
[im 22/28]
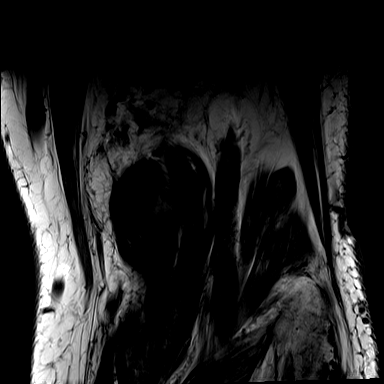
[im 28/28]
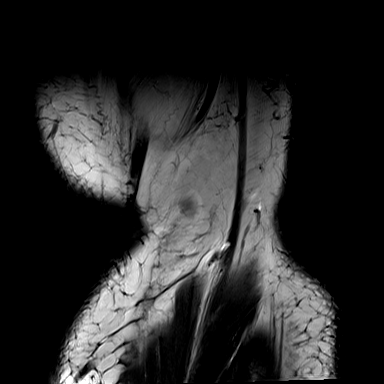

[Series 9: PD fat-sat · coronal · left · 3.0mm · 0.47mm/px · 7 of 34 slices shown (1 of 2)]
[im 1/34]
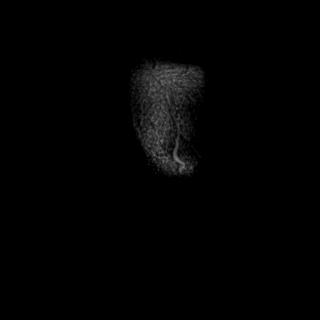
[im 6/34]
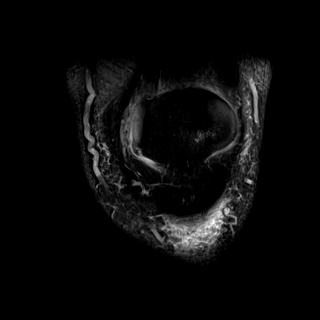
[im 12/34]
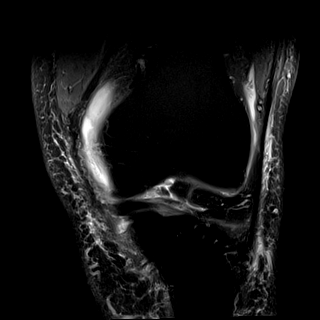
[im 17/34]
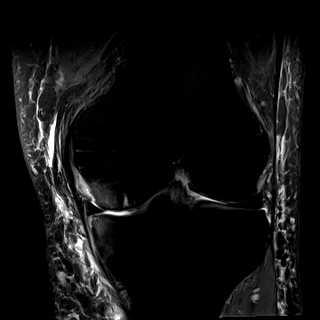
[im 23/34]
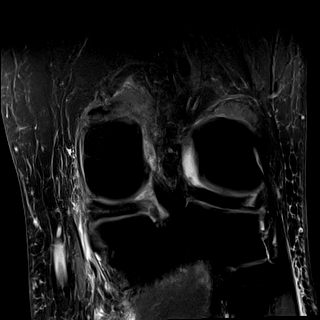
[im 28/34]
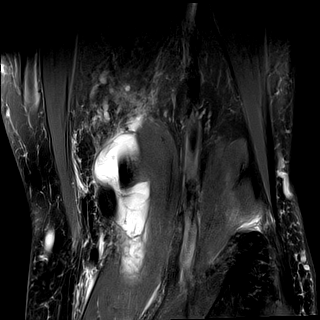
[im 34/34]
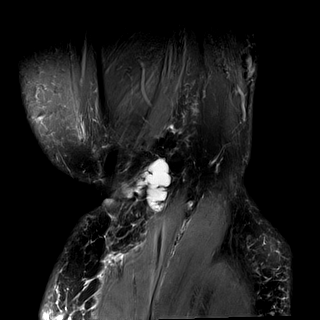

[Series 10: PD fat-sat · sagittal · left · 3.0mm · 0.47mm/px · 7 of 32 slices shown (2 of 2)]
[im 1/32]
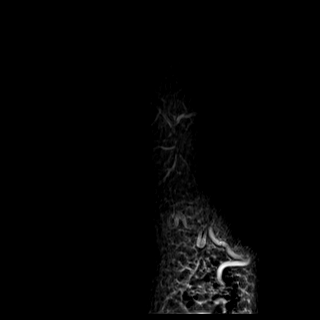
[im 6/32]
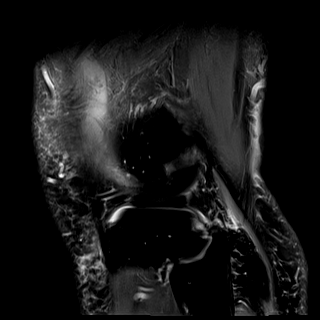
[im 11/32]
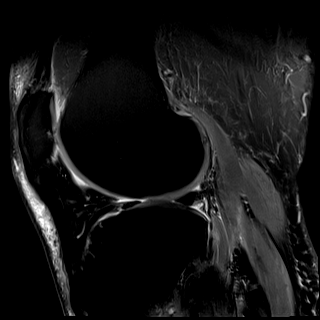
[im 16/32]
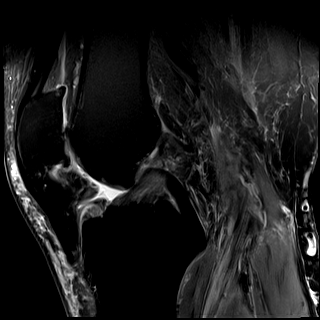
[im 21/32]
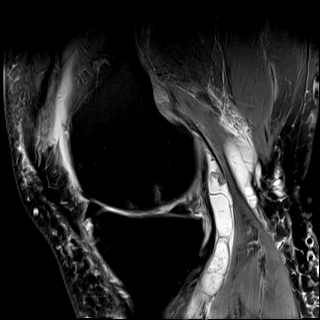
[im 26/32]
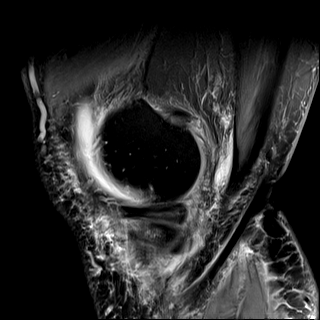
[im 32/32]
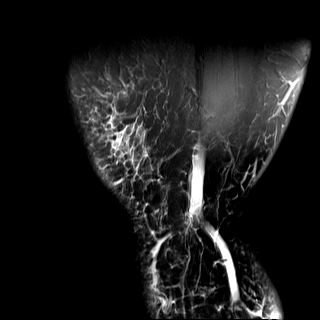

[Series 11: T2 fat-sat · sagittal · left · 3.0mm · 0.47mm/px · 7 of 33 slices shown (3 of 3)]
[im 1/33]
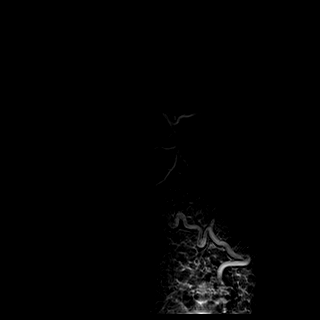
[im 6/33]
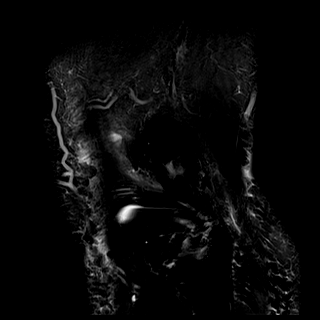
[im 11/33]
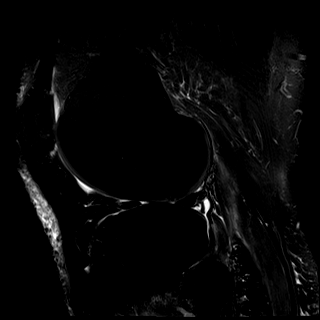
[im 17/33]
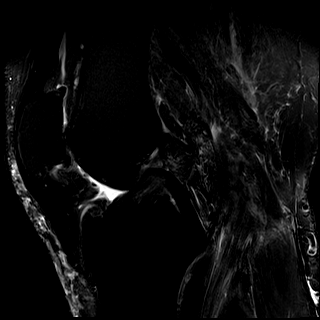
[im 22/33]
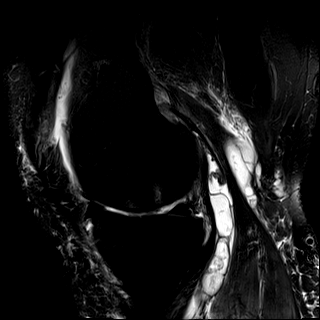
[im 27/33]
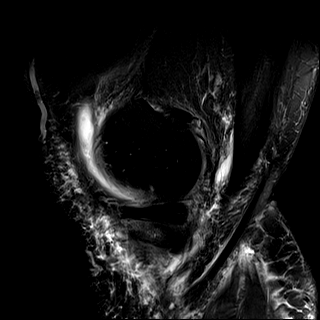
[im 33/33]
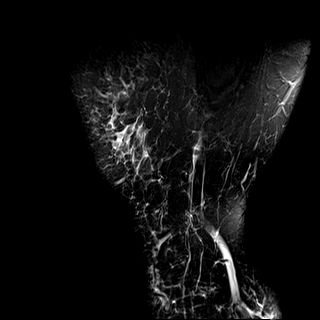

[40 of 40 positions shown; findings below may reference images not displayed]

FINDINGS: MENISCI

Medial meniscus: Large radial tear of the root of the posterior
horn. Amorphous signal in the root of the anterior horn, most likely
due to degeneration.

Lateral meniscus:  Unremarkable

LIGAMENTS

Cruciates:  Unremarkable

Collaterals: Mild edema tracks adjacent to the MCL. This can be
incidental but in the appropriate clinical circumstance could
represent grade 1 sprain.

CARTILAGE

Patellofemoral: Severe chondral thinning especially medially and
along the posterior patellar ridge. Subtle foci of subcortical
marrow edema. Marginal spurring.

Medial: Moderate to severe degenerative chondral thinning with mild
chondral irregularity and small foci of subcortical marrow edema.
Mild marginal spurring. Small focal chondral defect posteriorly
along the medial femoral condyle with sagittal orientation as shown
on image [DATE].

Lateral:  Unremarkable

Joint:  Small knee joint effusion.  Thin medial plica.

Popliteal Fossa: Multilobular Baker's cyst extending at least 9 cm
vertically. Mild infiltrative edema in the popliteal space. Fatty
replacement of the proximal soleus muscle.

Extensor Mechanism: Mild proximal and distal patellar tendinopathy.
Prepatellar subcutaneous edema.

Bones:  Spurring of the intercondylar notch and tibial spine.

Other: No supplemental non-categorized findings.
IMPRESSION: 1. Large radial tear of the root of the posterior horn medial
meniscus.
2. Mild edema tracks adjacent to the MCL. This can be incidental but
in the appropriate clinical circumstance could represent grade 1
sprain.
3. Moderate to severe degenerative chondral thinning in the
patellofemoral joint and medial compartment.
4. Small knee joint effusion with large Baker's cyst.
5. Mild proximal and distal patellar tendinopathy.
6. Fatty replacement of the proximal soleus muscle.

## 2020-05-05 ENCOUNTER — Encounter: Payer: Self-pay | Admitting: Family Medicine

## 2020-05-05 ENCOUNTER — Ambulatory Visit (INDEPENDENT_AMBULATORY_CARE_PROVIDER_SITE_OTHER): Payer: BC Managed Care – PPO | Admitting: Family Medicine

## 2020-05-05 DIAGNOSIS — S83282D Other tear of lateral meniscus, current injury, left knee, subsequent encounter: Secondary | ICD-10-CM

## 2020-05-05 NOTE — Progress Notes (Signed)
Virtual Visit via Video Note  I connected with James [name] on 05/05/20 at  4:15 PM EDT by a video enabled telemedicine application and verified that I am speaking with the correct person using two identifiers. On phone call.  Patient was accompanied with wife Location: Patient: Patient in home setting Provider: In office setting   I discussed the limitations of evaluation and management by telemedicine and the availability of in person appointments. The patient expressed understanding and agreed to proceed.  History of Present Illness: 69 year old gentleman who was having instability of the left knee and had more locking mechanism, sent for an MRI that showed a very large meniscal tear noted.  This was independently visualized by me.  Patient does have areas low of severe arthritic changes of the patellofemoral as well as the medial joint space but not diffusely.  Patient had questions about what to potentially are the different treatment options and what to do next.  Patient continues to have pain fairly regularly at this time.    Observations/Objective: Alert and oriented x3 difficulty with virtual platform so everything was on a phone call   Assessment and Plan: 69 year old gentleman with meniscal tear and arthritic change of the knee.  Discussed different treatment options and we have decided on surgical intervention.  Patient wants to talk to the surgeon about the idea of the right true knee replacement versus the possibility of arthroscopic procedure.  I did not want to confirm which one would be the best results at this time.  I do think that both are a opportunity.  Patient wants to evaluate different positions and then will write [place] back on who we would like [place] to refer to.   Follow Up Instructions: Awaiting for patient to write back     I discussed the assessment and treatment plan with the patient. The patient was provided an opportunity to ask questions and all were answered.  The patient agreed with the plan and demonstrated an understanding of the instructions.   The patient was advised to call back or seek an in-person evaluation if the symptoms worsen or if the condition fails to improve as anticipated.  I provided 25 minutes of face-to-face time during this encounter.   Lyndal Pulley, DO

## 2020-05-05 NOTE — Patient Instructions (Signed)
Dr.Rowan at [place] Dr. Maureen Ralphs with [facility]

## 2020-05-25 ENCOUNTER — Other Ambulatory Visit: Payer: Self-pay

## 2020-05-25 DIAGNOSIS — S83282D Other tear of lateral meniscus, current injury, left knee, subsequent encounter: Secondary | ICD-10-CM

## 2020-05-25 DIAGNOSIS — S83241D Other tear of medial meniscus, current injury, right knee, subsequent encounter: Secondary | ICD-10-CM

## 2020-05-26 DIAGNOSIS — M1711 Unilateral primary osteoarthritis, right knee: Secondary | ICD-10-CM | POA: Diagnosis not present

## 2020-05-26 DIAGNOSIS — M170 Bilateral primary osteoarthritis of knee: Secondary | ICD-10-CM | POA: Diagnosis not present

## 2020-05-26 DIAGNOSIS — M1712 Unilateral primary osteoarthritis, left knee: Secondary | ICD-10-CM | POA: Diagnosis not present

## 2021-04-04 ENCOUNTER — Telehealth: Payer: Self-pay | Admitting: Family Medicine

## 2021-04-04 ENCOUNTER — Encounter: Payer: Self-pay | Admitting: Family Medicine

## 2021-04-04 ENCOUNTER — Other Ambulatory Visit: Payer: Self-pay

## 2021-04-04 ENCOUNTER — Ambulatory Visit: Payer: BC Managed Care – PPO | Admitting: Family Medicine

## 2021-04-04 VITALS — BP 142/96 | HR 82 | Temp 98.8°F | Wt 208.0 lb

## 2021-04-04 DIAGNOSIS — Z0000 Encounter for general adult medical examination without abnormal findings: Secondary | ICD-10-CM

## 2021-04-04 MED ORDER — ALBUTEROL SULFATE 108 (90 BASE) MCG/ACT IN AEPB: 2.0000 | INHALATION_SPRAY | RESPIRATORY_TRACT | 11 refills | Status: DC | PRN

## 2021-04-04 NOTE — Progress Notes (Signed)
Subjective:    Patient ID: James [name], male    DOB: 1951-03-26, 70 y.o.   MRN: [id number]  HPI Here for a well exam. He feels well except for chronic left knee pain. He asw Dr. Wynelle Link last year, and he recommended a steroid injection. However Khase disagreed and now he is looking to get a second opinion. Otherwise his BP has crept up over the past year. He says that's because he has been consuming a lot of salt.    Review of Systems  Constitutional: Negative.   HENT: Negative.    Eyes: Negative.   Respiratory: Negative.    Cardiovascular: Negative.   Gastrointestinal: Negative.   Genitourinary: Negative.   Musculoskeletal:  Positive for arthralgias.  Skin: Negative.   Neurological: Negative.   Psychiatric/Behavioral: Negative.        Objective:   Physical Exam Constitutional:      General: He is not in acute distress.    Appearance: Normal appearance. He is well-developed. He is not diaphoretic.     Comments: He walks with a cane   HENT:     Head: Normocephalic and atraumatic.     Right Ear: External ear normal.     Left Ear: External ear normal.     Nose: Nose normal.     Mouth/Throat:     Pharynx: No oropharyngeal exudate.  Eyes:     General: No scleral icterus.       Right eye: No discharge.        Left eye: No discharge.     Conjunctiva/sclera: Conjunctivae normal.     Pupils: Pupils are equal, round, and reactive to light.  Neck:     Thyroid: No thyromegaly.     Vascular: No JVD.     Trachea: No tracheal deviation.  Cardiovascular:     Rate and Rhythm: Normal rate and regular rhythm.     Heart sounds: Normal heart sounds. No murmur heard.   No friction rub. No gallop.  Pulmonary:     Effort: Pulmonary effort is normal. No respiratory distress.     Breath sounds: Normal breath sounds. No wheezing or rales.  Chest:     Chest wall: No tenderness.  Abdominal:     General: Bowel sounds are normal. There is no distension.     Palpations: Abdomen is  soft. There is no mass.     Tenderness: There is no abdominal tenderness. There is no guarding or rebound.  Genitourinary:    Penis: Normal. No tenderness.      Testes: Normal.     Prostate: Normal.     Rectum: Normal. Guaiac result negative.  Musculoskeletal:        General: No tenderness. Normal range of motion.     Cervical back: Neck supple.  Lymphadenopathy:     Cervical: No cervical adenopathy.  Skin:    General: Skin is warm and dry.     Coloration: Skin is not pale.     Findings: No erythema or rash.  Neurological:     Mental Status: He is alert and oriented to person, place, and time.     Cranial Nerves: No cranial nerve deficit.     Motor: No abnormal muscle tone.     Coordination: Coordination normal.     Deep Tendon Reflexes: Reflexes are normal and symmetric. Reflexes normal.  Psychiatric:        Behavior: Behavior normal.        Thought Content: Thought content normal.  Judgment: Judgment normal.          Assessment & Plan:  Well exam. We discussed diet and exercise. Set up fasting labs sometime soon. I recommended starting him on a BP medication, but he declined. Instead he will reduce his salt  intake and we will check him again in 3 months.  Alysia Penna, MD

## 2021-04-04 NOTE — Telephone Encounter (Signed)
Spoke with pt advised of Dr Sarajane Jews advise, verbalized understanding

## 2021-04-04 NOTE — Telephone Encounter (Signed)
I would recommend either Dr. Alphonzo Severance or Dr. Paralee Cancel

## 2021-04-04 NOTE — Telephone Encounter (Signed)
Patient says that Dr. Sarajane Jews is supposed to be giving him a few names of orthopedic doctors for a referral for him to research.  Patient says that someone can give him a call with those doctors when Dr. Sarajane Jews has them.  Please advise.

## 2021-04-04 NOTE — Telephone Encounter (Signed)
Patient was seen in office today. Please advise

## 2021-04-11 ENCOUNTER — Other Ambulatory Visit: Payer: Self-pay

## 2021-04-11 ENCOUNTER — Other Ambulatory Visit (INDEPENDENT_AMBULATORY_CARE_PROVIDER_SITE_OTHER): Payer: BC Managed Care – PPO

## 2021-04-11 DIAGNOSIS — Z0000 Encounter for general adult medical examination without abnormal findings: Secondary | ICD-10-CM

## 2021-04-11 LAB — CBC WITH DIFFERENTIAL/PLATELET
Basophils Absolute: 0.1 10*3/uL (ref 0.0–0.1)
Basophils Relative: 1.4 % (ref 0.0–3.0)
Eosinophils Absolute: 0.1 10*3/uL (ref 0.0–0.7)
Eosinophils Relative: 1.7 % (ref 0.0–5.0)
HCT: 40.9 % (ref 39.0–52.0)
Hemoglobin: 14 g/dL (ref 13.0–17.0)
Lymphocytes Relative: 40.3 % (ref 12.0–46.0)
Lymphs Abs: 2.7 10*3/uL (ref 0.7–4.0)
MCHC: 34.2 g/dL (ref 30.0–36.0)
MCV: 98.8 fl (ref 78.0–100.0)
Monocytes Absolute: 0.7 10*3/uL (ref 0.1–1.0)
Monocytes Relative: 10.1 % (ref 3.0–12.0)
Neutro Abs: 3.1 10*3/uL (ref 1.4–7.7)
Neutrophils Relative %: 46.5 % (ref 43.0–77.0)
Platelets: 221 10*3/uL (ref 150.0–400.0)
RBC: 4.13 Mil/uL — ABNORMAL LOW (ref 4.22–5.81)
RDW: 12.3 % (ref 11.5–15.5)
WBC: 6.7 10*3/uL (ref 4.0–10.5)

## 2021-04-11 LAB — BASIC METABOLIC PANEL
BUN: 13 mg/dL (ref 6–23)
CO2: 28 mEq/L (ref 19–32)
Calcium: 9.9 mg/dL (ref 8.4–10.5)
Chloride: 105 mEq/L (ref 96–112)
Creatinine, Ser: 1.03 mg/dL (ref 0.40–1.50)
GFR: 72.98 mL/min (ref >60.00)
Glucose, Bld: 115 mg/dL — ABNORMAL HIGH (ref 70–99)
Potassium: 5 mEq/L (ref 3.5–5.1)
Sodium: 139 mEq/L (ref 135–145)

## 2021-04-11 LAB — PSA: PSA: 3 ng/mL (ref 0.10–4.00)

## 2021-04-11 LAB — HEPATIC FUNCTION PANEL
ALT: 26 U/L (ref 0–53)
AST: 28 U/L (ref 0–37)
Albumin: 4.2 g/dL (ref 3.5–5.2)
Alkaline Phosphatase: 89 U/L (ref 39–117)
Bilirubin, Direct: 0.2 mg/dL (ref 0.0–0.3)
Total Bilirubin: 1.2 mg/dL (ref 0.2–1.2)
Total Protein: 6.3 g/dL (ref 6.0–8.3)

## 2021-04-11 LAB — LIPID PANEL
Cholesterol: 194 mg/dL (ref 0–200)
HDL: 35.2 mg/dL — ABNORMAL LOW (ref >39.00)
LDL Cholesterol: 125 mg/dL — ABNORMAL HIGH (ref 0–99)
NonHDL: 158.65
Total CHOL/HDL Ratio: 6
Triglycerides: 170 mg/dL — ABNORMAL HIGH (ref 0.0–149.0)
VLDL: 34 mg/dL (ref 0.0–40.0)

## 2021-04-11 LAB — TSH: TSH: 1.05 u[IU]/mL (ref 0.35–5.50)

## 2021-04-11 LAB — T3, FREE: T3, Free: 3.3 pg/mL (ref 2.3–4.2)

## 2021-04-11 LAB — T4, FREE: Free T4: 0.99 ng/dL (ref 0.60–1.60)

## 2021-04-11 LAB — HEMOGLOBIN A1C: Hgb A1c MFr Bld: 6 % (ref 4.6–6.5)

## 2021-04-19 ENCOUNTER — Telehealth: Payer: Self-pay

## 2021-04-19 NOTE — Telephone Encounter (Signed)
Pt is aware of the following message

## 2021-05-19 DIAGNOSIS — M25861 Other specified joint disorders, right knee: Secondary | ICD-10-CM | POA: Diagnosis not present

## 2021-05-19 DIAGNOSIS — M2352 Chronic instability of knee, left knee: Secondary | ICD-10-CM | POA: Diagnosis not present

## 2021-05-19 DIAGNOSIS — M25762 Osteophyte, left knee: Secondary | ICD-10-CM | POA: Diagnosis not present

## 2021-05-19 DIAGNOSIS — M2351 Chronic instability of knee, right knee: Secondary | ICD-10-CM | POA: Diagnosis not present

## 2021-05-19 DIAGNOSIS — M170 Bilateral primary osteoarthritis of knee: Secondary | ICD-10-CM | POA: Diagnosis not present

## 2021-05-19 DIAGNOSIS — M25561 Pain in right knee: Secondary | ICD-10-CM | POA: Diagnosis not present

## 2021-05-19 DIAGNOSIS — G8929 Other chronic pain: Secondary | ICD-10-CM | POA: Diagnosis not present

## 2021-05-19 DIAGNOSIS — M25761 Osteophyte, right knee: Secondary | ICD-10-CM | POA: Diagnosis not present

## 2021-05-20 ENCOUNTER — Other Ambulatory Visit: Payer: Self-pay | Admitting: Orthopedic Surgery

## 2021-05-20 DIAGNOSIS — M25561 Pain in right knee: Secondary | ICD-10-CM

## 2021-06-05 ENCOUNTER — Ambulatory Visit
Admission: RE | Admit: 2021-06-05 | Discharge: 2021-06-05 | Disposition: A | Discharge status: Home / Self Care | Payer: BC Managed Care – PPO | Source: Ambulatory Visit | Attending: Orthopedic Surgery | Admitting: Orthopedic Surgery

## 2021-06-05 DIAGNOSIS — M1711 Unilateral primary osteoarthritis, right knee: Secondary | ICD-10-CM | POA: Diagnosis not present

## 2021-06-05 DIAGNOSIS — M23221 Derangement of posterior horn of medial meniscus due to old tear or injury, right knee: Secondary | ICD-10-CM | POA: Diagnosis not present

## 2021-06-05 DIAGNOSIS — M25562 Pain in left knee: Secondary | ICD-10-CM

## 2021-06-05 DIAGNOSIS — M7121 Synovial cyst of popliteal space [Baker], right knee: Secondary | ICD-10-CM | POA: Diagnosis not present

## 2021-06-05 DIAGNOSIS — R600 Localized edema: Secondary | ICD-10-CM | POA: Diagnosis not present

## 2021-06-05 DIAGNOSIS — M25561 Pain in right knee: Secondary | ICD-10-CM

## 2021-06-05 IMAGING — MR MR KNEE*R* W/O CM
4 of 7 series · 18 of 40 positions shown · non-contrast
Comparison: X-ray knee [DATE].

CLINICAL DATA: Patient complains of generalized knee pain x 2
years. History of torn meniscus. Patient denies history of
therapeutic injections or surgeries.

EXAM:
MRI OF THE RIGHT KNEE WITHOUT CONTRAST
TECHNIQUE: Multiplanar, multisequence MR imaging of the knee was performed. No
intravenous contrast was administered.

[Series 3: T2 fat-sat · axial · 4.0mm · 0.29mm/px · z∈[-61,+31]mm · 3 of 27 slices shown]
[im 6/27]
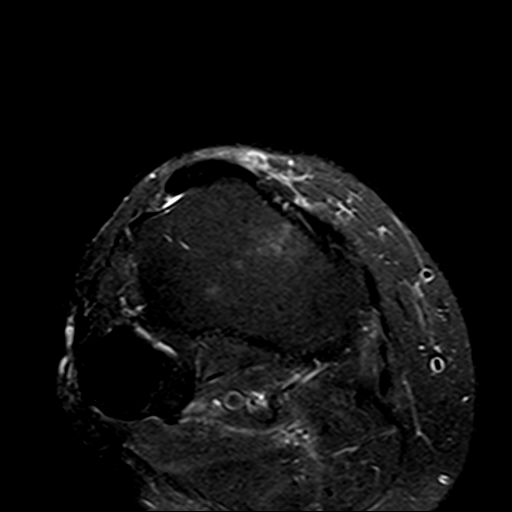
[im 16/27]
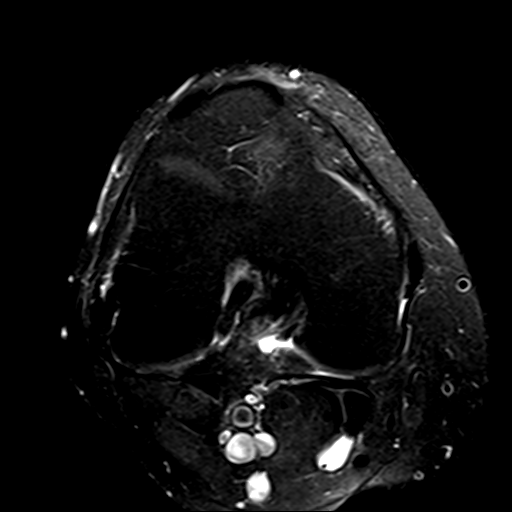
[im 27/27]
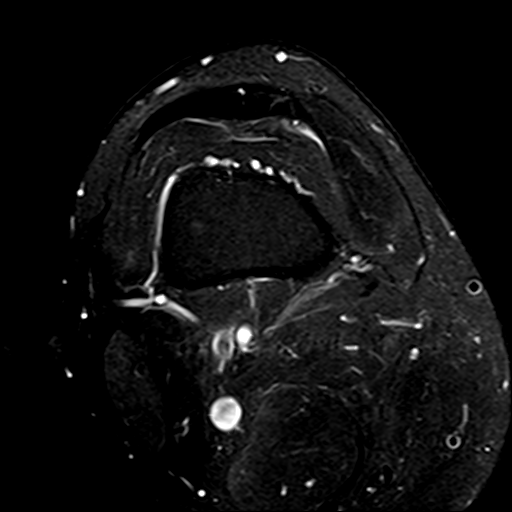

[Series 6: PD fat-sat · coronal · 3.0mm · 0.29mm/px · 7 of 30 slices shown (1 of 3)]
[im 1/30]
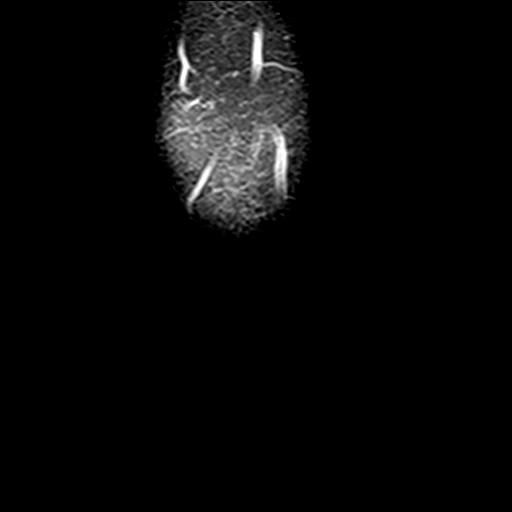
[im 5/30]
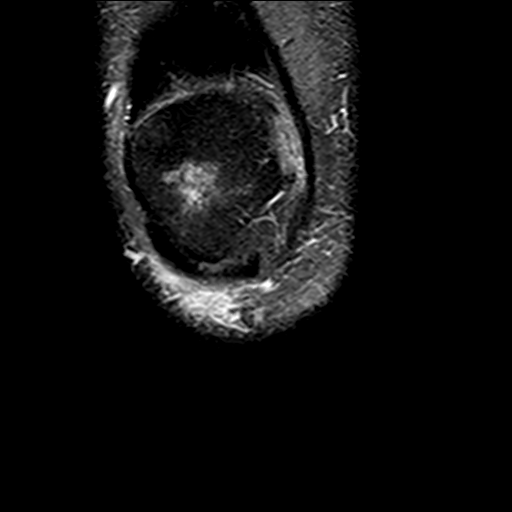
[im 10/30]
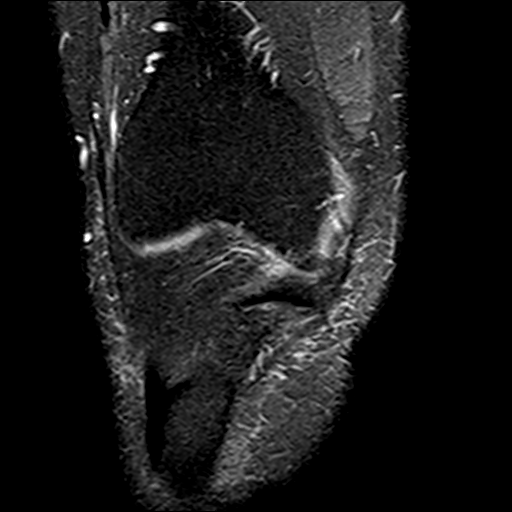
[im 15/30]
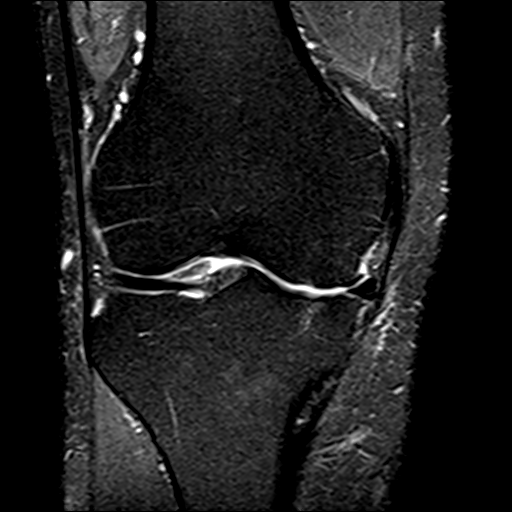
[im 20/30]
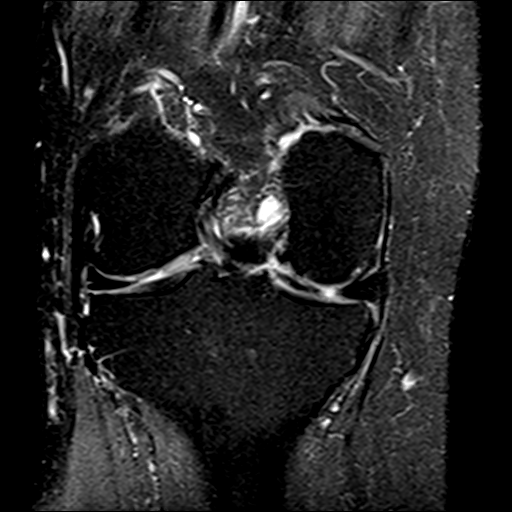
[im 25/30]
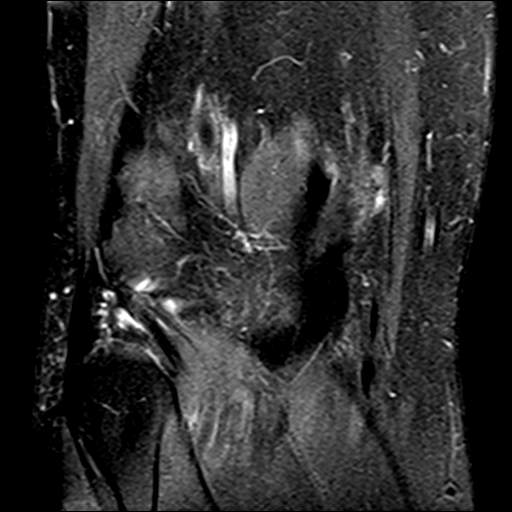
[im 30/30]
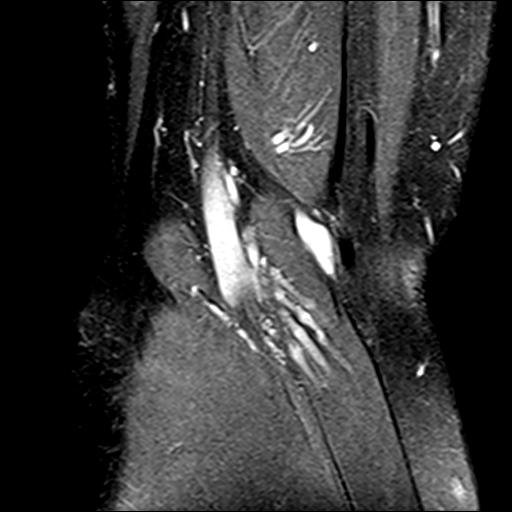

[Series 7: PD fat-sat · sagittal · 3.0mm · 0.29mm/px · 6 of 30 slices shown (2 of 3)]
[im 1/30]
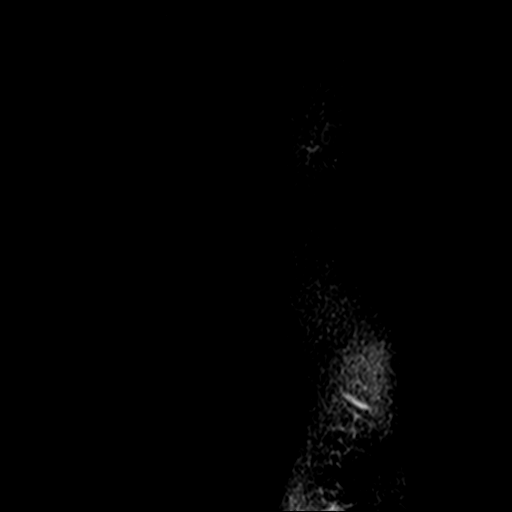
[im 5/30]
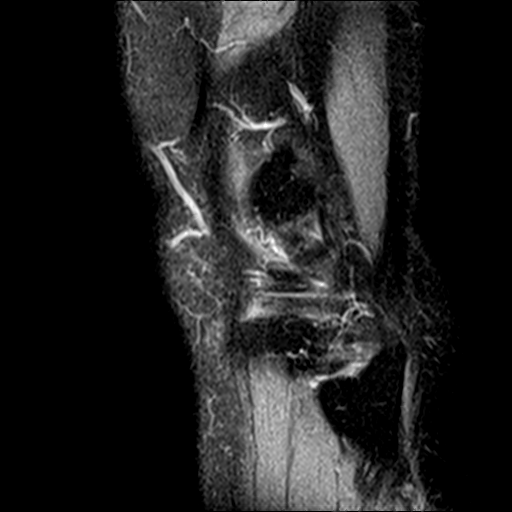
[im 10/30]
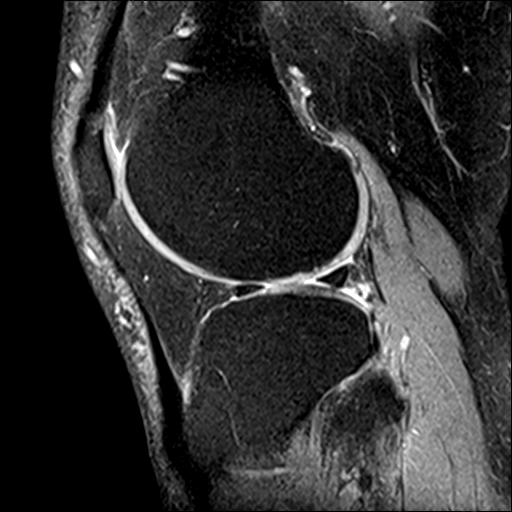
[im 15/30]
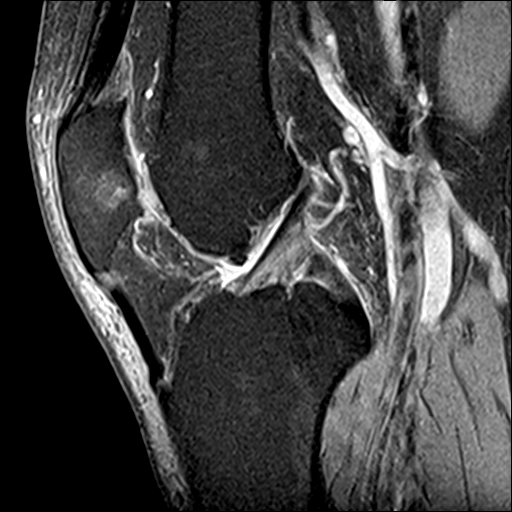
[im 20/30]
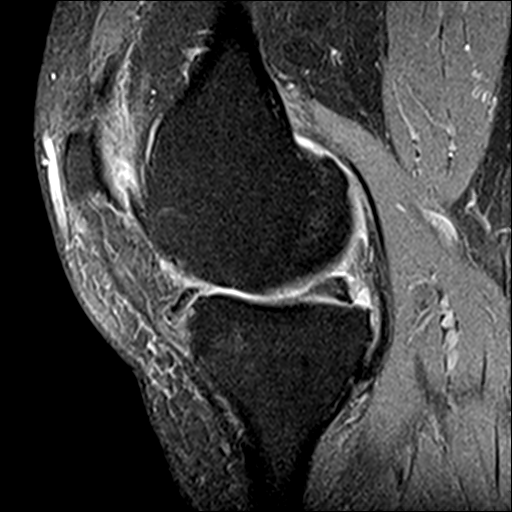
[im 25/30]
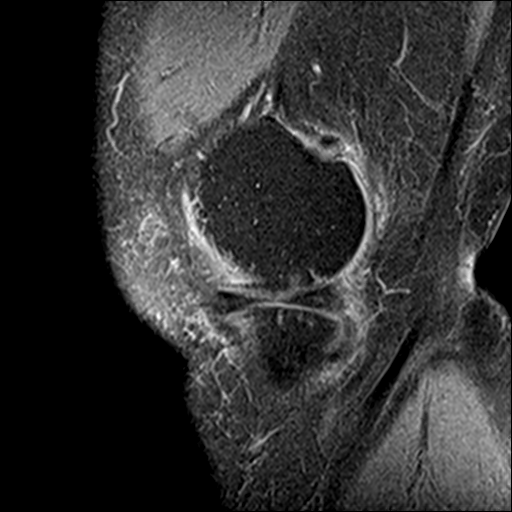

[Series 9: PD fat-sat · coronal · 2.0mm · 0.29mm/px · 2 of 11 slices shown (3 of 3)]
[im 1/11]
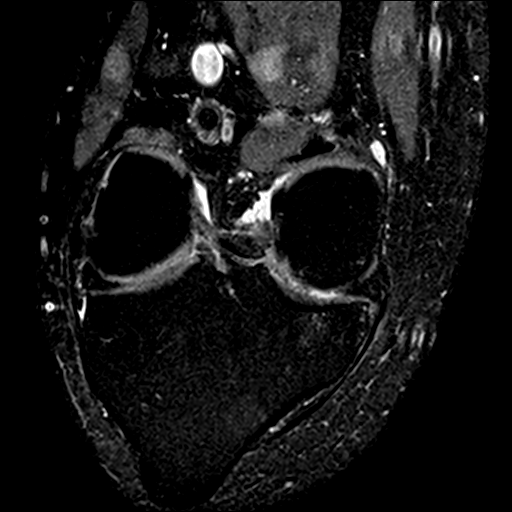
[im 11/11]
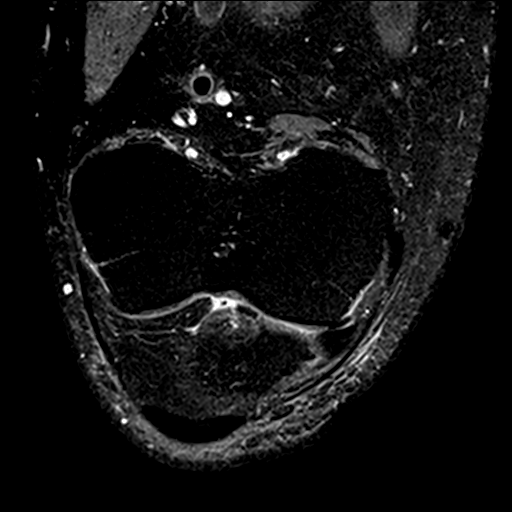

[18 of 40 positions shown; findings below may reference images not displayed]

FINDINGS: MENISCI

Medial meniscus: Intrasubstance degeneration of the medial meniscus
with focal partial-thickness radial tear of the medial meniscal
posterior horn.

Lateral meniscus:  Intact.

LIGAMENTS

Cruciates: Intact ACL and PCL.

Collaterals: Proximal MCL is mildly thickened without tear or
periligamentous edema, suggesting sequela of remote trauma. Lateral
collateral ligament complex intact.

CARTILAGE

Patellofemoral: High-grade cartilage loss with areas of
full-thickness fissuring involving the patella and trochlear groove.
Prominent subchondral cystic changes along the lateral patellar
facet.

Medial: Full-thickness chondral loss of the weight-bearing medial
compartment with mild reactive subchondral marrow signal changes.

Lateral: Near full-thickness chondral fissure of the lateral femoral
condyle centrally (series 5, image 9).

Joint: No significant joint effusion. Fat pads within normal limits.

Popliteal Fossa:  Small Baker's cyst.  Intact popliteus tendon.

Extensor Mechanism: Intact quadriceps tendon and patellofemoral
tendon.

Bones: Tricompartmental joint space narrowing with marginal
osteophyte formation. Reactive degenerative subchondral marrow
signal changes. No fracture. No suspicious bone lesion.

Other: Mild prepatellar subcutaneous edema.
IMPRESSION: 1. Moderate-to-severe tricompartmental osteoarthritis with areas of
full-thickness chondral loss in the medial and patellofemoral
compartments.
2. Intrasubstance degeneration of the medial meniscus with focal
partial-thickness radial tear of the left posterior horn.
3. Small Baker's cyst.

## 2021-06-09 DIAGNOSIS — M1711 Unilateral primary osteoarthritis, right knee: Secondary | ICD-10-CM | POA: Diagnosis not present

## 2021-06-09 DIAGNOSIS — M2352 Chronic instability of knee, left knee: Secondary | ICD-10-CM | POA: Diagnosis not present

## 2021-06-10 ENCOUNTER — Ambulatory Visit: Payer: BC Managed Care – PPO | Admitting: Family Medicine

## 2021-06-10 ENCOUNTER — Other Ambulatory Visit: Payer: Self-pay

## 2021-06-10 ENCOUNTER — Encounter: Payer: Self-pay | Admitting: Family Medicine

## 2021-06-10 VITALS — BP 140/98 | HR 79 | Temp 97.8°F | Wt 210.0 lb

## 2021-06-10 DIAGNOSIS — I10 Essential (primary) hypertension: Secondary | ICD-10-CM

## 2021-06-10 DIAGNOSIS — Z01818 Encounter for other preprocedural examination: Secondary | ICD-10-CM

## 2021-06-10 MED ORDER — LOSARTAN POTASSIUM 50 MG PO TABS: 50.0000 mg | ORAL_TABLET | Freq: Every day | ORAL | 2 refills | Status: DC

## 2021-06-10 NOTE — Progress Notes (Signed)
   Subjective:    Patient ID: James [name], male    DOB: 1951/07/30, 70 y.o.   MRN: [id number]  HPI Here for a preoperative evaluation. 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 Lara Mulch sometime in December. After he recovers from that, they plan to replace the other knee. He feels fine. He had a full wellness exam her in August and all his labs were unremarkable. However we noted that his BP has been a little high for the past 3 years. He declined medication at that time, saying he wanted to try diet and exercise first. I checked his BP in both arms today, getting 140/98 in one and 140/100 in the other.    Review of Systems  Constitutional: Negative.   Respiratory: Negative.    Cardiovascular: Negative.   Gastrointestinal: Negative.   Genitourinary: Negative.   Musculoskeletal:  Positive for arthralgias.  Neurological: Negative.       Objective:   Physical Exam Constitutional:      Appearance: Normal appearance.  Cardiovascular:     Rate and Rhythm: Normal rate and regular rhythm.     Pulses: Normal pulses.     Heart sounds: Normal heart sounds.     Comments: EKG today is normal  Pulmonary:     Effort: Pulmonary effort is normal.     Breath sounds: Normal breath sounds.  Abdominal:     General: Abdomen is flat. Bowel sounds are normal. There is no distension.     Palpations: Abdomen is soft. There is no mass.     Tenderness: There is no abdominal tenderness. There is no guarding or rebound.     Hernia: No hernia is present.  Musculoskeletal:     Right lower leg: No edema.     Left lower leg: No edema.  Neurological:     General: No focal deficit present.     Mental Status: He is alert and oriented to person, place, and time.          Assessment & Plan:  Preoperative evaluation. The only issue we see is high BP. This started about 18 months ago, so we have diagnosed with HTN. He will start on Losartan 50 mg daily. Recheck in 3 weeks. We will  re-evaluate his fitness for surgery at that time. We spent a total of ( 35  ) minutes reviewing records and discussing these issues.   Alysia Penna, MD

## 2021-06-22 ENCOUNTER — Telehealth: Payer: Self-pay | Admitting: Family Medicine

## 2021-06-22 NOTE — Telephone Encounter (Signed)
There is no need for him to come in. The BP is obviously well controlled at home. My target was for this to average below 833 systolic and below 90 diastolic. Please cancel his scheduled OV. I have cleared him for surgery, and we can now fax this back to his surgeon.

## 2021-06-22 NOTE — Telephone Encounter (Signed)
Patient is monitoring his BP at home to be able to have surgery done.  He wants to know what his target BP needs to be to get clearance from Dr. Sarajane Jews.  He is wondering because surgery dates are getting booked up so he kinda wants to know his target number.  Patient states let Dr. Sarajane Jews know he has stopped the Tylenol.  BP readings:  120-140's over mid 80's  Please give patient a call to let him know if he can get an earlier appointment for clearance.  The best time would be later this afternoon or tomorrow morning.

## 2021-06-22 NOTE — Telephone Encounter (Signed)
Pt wants to know if he can reschedule his appointment to a closer date than previously scheduled for 07/01/2021, Pt state that his BP reading have dropped and would like to get the clearance completed so he can schedule the surgery date. Please advise

## 2021-06-23 NOTE — Telephone Encounter (Signed)
Pt surgical clearance form has been completed and faxed to Dr Ronnie [facility]. Pt was notified state to hold the original fro pt to pick up from the office, copy sent to scanning

## 2021-06-27 ENCOUNTER — Telehealth: Payer: Self-pay | Admitting: Family Medicine

## 2021-06-27 NOTE — Telephone Encounter (Signed)
Spoke with pt advised that Dr Sarajane Jews did not need to see him in the office for a f/u on his Bp, Pt clearance form was received by the surgery clinic and pt is already scheduled for 07/2021. Pt states that the call he received could be automated reminder call for the previous app

## 2021-06-27 NOTE — Telephone Encounter (Signed)
Patient states that Dr Sarajane Jews has already completed his surgical clearance form and told patient he didn't need to come back in for his BP right now.  07/01/21 appt was canceled for when Dr. Sarajane Jews was out of the office. However patient states if Dr. Sarajane Jews wants him to come in sooner to get it checked he will, just to let him know.

## 2021-07-01 ENCOUNTER — Ambulatory Visit: Payer: BC Managed Care – PPO | Admitting: Family Medicine

## 2021-07-15 DIAGNOSIS — M1711 Unilateral primary osteoarthritis, right knee: Secondary | ICD-10-CM | POA: Diagnosis not present

## 2021-08-10 ENCOUNTER — Telehealth: Payer: Self-pay

## 2021-08-10 ENCOUNTER — Other Ambulatory Visit: Payer: Self-pay

## 2021-08-10 MED ORDER — LOSARTAN POTASSIUM 50 MG PO TABS
50.0000 mg | ORAL_TABLET | Freq: Every day | ORAL | 2 refills | Status: DC
Start: 2021-08-10 — End: 2021-11-09

## 2021-08-10 NOTE — Telephone Encounter (Signed)
Patient called requesting Rx refill  losartan (COZAAR) 50 MG tablet

## 2021-08-10 NOTE — Telephone Encounter (Signed)
Rx was sent to pt pharmacy

## 2021-08-15 DIAGNOSIS — M1711 Unilateral primary osteoarthritis, right knee: Secondary | ICD-10-CM | POA: Diagnosis not present

## 2021-08-25 DIAGNOSIS — Z96651 Presence of right artificial knee joint: Secondary | ICD-10-CM | POA: Diagnosis not present

## 2021-08-25 DIAGNOSIS — M25461 Effusion, right knee: Secondary | ICD-10-CM | POA: Diagnosis not present

## 2021-08-25 DIAGNOSIS — M1712 Unilateral primary osteoarthritis, left knee: Secondary | ICD-10-CM | POA: Diagnosis not present

## 2021-09-07 ENCOUNTER — Other Ambulatory Visit: Payer: Self-pay | Admitting: Family Medicine

## 2021-10-25 DIAGNOSIS — M1712 Unilateral primary osteoarthritis, left knee: Secondary | ICD-10-CM | POA: Diagnosis not present

## 2021-11-09 ENCOUNTER — Other Ambulatory Visit: Payer: Self-pay | Admitting: Family Medicine

## 2021-11-15 DIAGNOSIS — M1712 Unilateral primary osteoarthritis, left knee: Secondary | ICD-10-CM | POA: Diagnosis not present

## 2021-11-16 ENCOUNTER — Telehealth: Payer: Self-pay | Admitting: Family Medicine

## 2021-11-16 DIAGNOSIS — M24611 Ankylosis, right shoulder: Secondary | ICD-10-CM | POA: Diagnosis not present

## 2021-11-16 DIAGNOSIS — G8918 Other acute postprocedural pain: Secondary | ICD-10-CM | POA: Diagnosis not present

## 2021-11-16 DIAGNOSIS — T8482XA Fibrosis due to internal orthopedic prosthetic devices, implants and grafts, initial encounter: Secondary | ICD-10-CM | POA: Diagnosis not present

## 2021-11-16 DIAGNOSIS — Z96651 Presence of right artificial knee joint: Secondary | ICD-10-CM | POA: Diagnosis not present

## 2021-11-16 NOTE — Telephone Encounter (Signed)
Patient called in stating that the pharmacy may need a permission from Dr.Fry to renew losartan (COZAAR) 50 MG tablet [[id number]]  at the pharmacy. ? ?Patient could be contacted at [phone]. ? ?Please advise. ?

## 2021-11-16 NOTE — Telephone Encounter (Signed)
Refill was sent to pharmacy on 11/09/21.   Called patient to make aware. ?

## 2021-12-19 DIAGNOSIS — M1712 Unilateral primary osteoarthritis, left knee: Secondary | ICD-10-CM | POA: Diagnosis not present

## 2021-12-29 DIAGNOSIS — Z96652 Presence of left artificial knee joint: Secondary | ICD-10-CM | POA: Diagnosis not present

## 2022-01-09 DIAGNOSIS — Z96652 Presence of left artificial knee joint: Secondary | ICD-10-CM | POA: Diagnosis not present

## 2022-01-09 DIAGNOSIS — M7989 Other specified soft tissue disorders: Secondary | ICD-10-CM | POA: Diagnosis not present

## 2022-01-09 DIAGNOSIS — M7122 Synovial cyst of popliteal space [Baker], left knee: Secondary | ICD-10-CM | POA: Diagnosis not present

## 2022-02-15 ENCOUNTER — Other Ambulatory Visit: Payer: Self-pay | Admitting: Family Medicine

## 2022-03-15 ENCOUNTER — Other Ambulatory Visit: Payer: Self-pay | Admitting: Family Medicine

## 2022-04-05 ENCOUNTER — Ambulatory Visit (INDEPENDENT_AMBULATORY_CARE_PROVIDER_SITE_OTHER): Payer: BC Managed Care – PPO | Admitting: Family Medicine

## 2022-04-05 ENCOUNTER — Encounter: Payer: Self-pay | Admitting: Family Medicine

## 2022-04-05 VITALS — BP 138/88 | HR 82 | Temp 98.4°F | Ht 70.0 in | Wt 207.0 lb

## 2022-04-05 DIAGNOSIS — Z23 Encounter for immunization: Secondary | ICD-10-CM

## 2022-04-05 DIAGNOSIS — Z0000 Encounter for general adult medical examination without abnormal findings: Secondary | ICD-10-CM | POA: Diagnosis not present

## 2022-04-05 MED ORDER — LOSARTAN POTASSIUM 50 MG PO TABS: 50.0000 mg | ORAL_TABLET | Freq: Every day | ORAL | 3 refills | Status: DC

## 2022-04-05 NOTE — Progress Notes (Signed)
   Subjective:    Patient ID: James [name], male    DOB: [birth year], 71 y.o.   MRN: [id number]  HPI Here for a well exam. He feels well and has no concerns.    Review of Systems  Constitutional: Negative.   HENT: Negative.    Eyes: Negative.   Respiratory: Negative.    Cardiovascular: Negative.   Gastrointestinal: Negative.   Genitourinary: Negative.   Musculoskeletal: Negative.   Skin: Negative.   Neurological: Negative.   Psychiatric/Behavioral: Negative.         Objective:   Physical Exam Constitutional:      General: He is not in acute distress.    Appearance: Normal appearance. He is well-developed. He is not diaphoretic.  HENT:     Head: Normocephalic and atraumatic.     Right Ear: External ear normal.     Left Ear: External ear normal.     Nose: Nose normal.     Mouth/Throat:     Pharynx: No oropharyngeal exudate.  Eyes:     General: No scleral icterus.       Right eye: No discharge.        Left eye: No discharge.     Conjunctiva/sclera: Conjunctivae normal.     Pupils: Pupils are equal, round, and reactive to light.  Neck:     Thyroid: No thyromegaly.     Vascular: No JVD.     Trachea: No tracheal deviation.  Cardiovascular:     Rate and Rhythm: Normal rate and regular rhythm.     Heart sounds: Normal heart sounds. No murmur heard.    No friction rub. No gallop.  Pulmonary:     Effort: Pulmonary effort is normal. No respiratory distress.     Breath sounds: Normal breath sounds. No wheezing or rales.  Chest:     Chest wall: No tenderness.  Abdominal:     General: Bowel sounds are normal. There is no distension.     Palpations: Abdomen is soft. There is no mass.     Tenderness: There is no abdominal tenderness. There is no guarding or rebound.  Genitourinary:    Penis: Normal. No tenderness.      Testes: Normal.     Prostate: Normal.     Rectum: Normal. Guaiac result negative.  Musculoskeletal:        General: No tenderness. Normal range of  motion.     Cervical back: Neck supple.  Lymphadenopathy:     Cervical: No cervical adenopathy.  Skin:    General: Skin is warm and dry.     Coloration: Skin is not pale.     Findings: No erythema or rash.  Neurological:     Mental Status: He is alert and oriented to person, place, and time.     Cranial Nerves: No cranial nerve deficit.     Motor: No abnormal muscle tone.     Coordination: Coordination normal.     Deep Tendon Reflexes: Reflexes are normal and symmetric. Reflexes normal.  Psychiatric:        Behavior: Behavior normal.        Thought Content: Thought content normal.        Judgment: Judgment normal.           Assessment & Plan:  Well exam. We discussed diet and exercise. Get fasting labs. Alysia Penna, MD

## 2022-04-06 ENCOUNTER — Other Ambulatory Visit (INDEPENDENT_AMBULATORY_CARE_PROVIDER_SITE_OTHER): Payer: BC Managed Care – PPO

## 2022-04-06 DIAGNOSIS — Z0000 Encounter for general adult medical examination without abnormal findings: Secondary | ICD-10-CM

## 2022-04-06 DIAGNOSIS — Z125 Encounter for screening for malignant neoplasm of prostate: Secondary | ICD-10-CM

## 2022-04-06 LAB — HEPATIC FUNCTION PANEL
ALT: 15 U/L (ref 0–53)
AST: 22 U/L (ref 0–37)
Albumin: 4.2 g/dL (ref 3.5–5.2)
Alkaline Phosphatase: 131 U/L — ABNORMAL HIGH (ref 39–117)
Bilirubin, Direct: 0.1 mg/dL (ref 0.0–0.3)
Total Bilirubin: 0.7 mg/dL (ref 0.2–1.2)
Total Protein: 6.4 g/dL (ref 6.0–8.3)

## 2022-04-06 LAB — LIPID PANEL
Cholesterol: 184 mg/dL (ref 0–200)
HDL: 36.7 mg/dL — ABNORMAL LOW (ref >39.00)
LDL Cholesterol: 118 mg/dL — ABNORMAL HIGH (ref 0–99)
NonHDL: 147.01
Total CHOL/HDL Ratio: 5
Triglycerides: 145 mg/dL (ref 0.0–149.0)
VLDL: 29 mg/dL (ref 0.0–40.0)

## 2022-04-06 LAB — CBC WITH DIFFERENTIAL/PLATELET
Basophils Absolute: 0.1 10*3/uL (ref 0.0–0.1)
Basophils Relative: 1 % (ref 0.0–3.0)
Eosinophils Absolute: 0.2 10*3/uL (ref 0.0–0.7)
Eosinophils Relative: 2.4 % (ref 0.0–5.0)
HCT: 39.2 % (ref 39.0–52.0)
Hemoglobin: 13.3 g/dL (ref 13.0–17.0)
Lymphocytes Relative: 38.1 % (ref 12.0–46.0)
Lymphs Abs: 2.7 10*3/uL (ref 0.7–4.0)
MCHC: 34 g/dL (ref 30.0–36.0)
MCV: 96.8 fl (ref 78.0–100.0)
Monocytes Absolute: 0.7 10*3/uL (ref 0.1–1.0)
Monocytes Relative: 10.4 % (ref 3.0–12.0)
Neutro Abs: 3.4 10*3/uL (ref 1.4–7.7)
Neutrophils Relative %: 48.1 % (ref 43.0–77.0)
Platelets: 234 10*3/uL (ref 150.0–400.0)
RBC: 4.05 Mil/uL — ABNORMAL LOW (ref 4.22–5.81)
RDW: 12.6 % (ref 11.5–15.5)
WBC: 7.2 10*3/uL (ref 4.0–10.5)

## 2022-04-06 LAB — BASIC METABOLIC PANEL
BUN: 11 mg/dL (ref 6–23)
CO2: 28 mEq/L (ref 19–32)
Calcium: 9.7 mg/dL (ref 8.4–10.5)
Chloride: 105 mEq/L (ref 96–112)
Creatinine, Ser: 0.96 mg/dL (ref 0.40–1.50)
GFR: 78.87 mL/min (ref >60.00)
Glucose, Bld: 117 mg/dL — ABNORMAL HIGH (ref 70–99)
Potassium: 4.3 mEq/L (ref 3.5–5.1)
Sodium: 139 mEq/L (ref 135–145)

## 2022-04-06 LAB — PSA: PSA: 3.95 ng/mL (ref 0.10–4.00)

## 2022-04-06 LAB — TSH: TSH: 1.2 u[IU]/mL (ref 0.35–5.50)

## 2022-04-06 LAB — HEMOGLOBIN A1C: Hgb A1c MFr Bld: 6.4 % (ref 4.6–6.5)

## 2022-06-06 DIAGNOSIS — Z96652 Presence of left artificial knee joint: Secondary | ICD-10-CM | POA: Diagnosis not present

## 2022-07-14 ENCOUNTER — Telehealth: Payer: Self-pay | Admitting: Family Medicine

## 2022-07-14 NOTE — Telephone Encounter (Signed)
Pt is aware that he has enough refills  at his pharmacy

## 2022-07-14 NOTE — Telephone Encounter (Signed)
Refill losartan (COZAAR) 50 MG tablet  asking for at least 3 refills,more if possible [id number] - [place], [place] - [address] Phone: [phone]  Fax: [phone]

## 2022-07-15 ENCOUNTER — Other Ambulatory Visit: Payer: Self-pay | Admitting: Family Medicine

## 2022-10-17 ENCOUNTER — Ambulatory Visit: Payer: No Typology Code available for payment source | Admitting: Family Medicine

## 2022-10-17 ENCOUNTER — Encounter: Payer: Self-pay | Admitting: Family Medicine

## 2022-10-17 VITALS — BP 126/80 | HR 82 | Temp 98.3°F | Wt 207.0 lb

## 2022-10-17 DIAGNOSIS — N6322 Unspecified lump in the left breast, upper inner quadrant: Secondary | ICD-10-CM | POA: Diagnosis not present

## 2022-10-17 MED ORDER — ALBUTEROL SULFATE 108 (90 BASE) MCG/ACT IN AEPB: 2.0000 | INHALATION_SPRAY | RESPIRATORY_TRACT | 11 refills | Status: DC | PRN

## 2022-10-17 NOTE — Progress Notes (Signed)
   Subjective:    Patient ID: James [name], male    DOB: 04-Apr-1951, 72 y.o.   MRN: [id number]  HPI Here to check a tender lump in th left breast that he noticed about a week ago. He started taking saw palmetto about 3 years ago for BPH symptoms, and it has helped a lot. He began to take a higher dose of this about 6 months ago.    Review of Systems  Constitutional: Negative.   Respiratory: Negative.    Cardiovascular: Negative.        Objective:   Physical Exam Constitutional:      Appearance: Normal appearance.  Cardiovascular:     Rate and Rhythm: Normal rate and regular rhythm.     Pulses: Normal pulses.     Heart sounds: Normal heart sounds.  Pulmonary:     Effort: Pulmonary effort is normal.     Breath sounds: Normal breath sounds.     Comments: There is a tender well defined firm lump in the left breast just superior to the nipple  Neurological:     Mental Status: He is alert.           Assessment & Plan:  Breast lump. This is likely a side effect of the saw palmetto, since it reduces the effective level of testosterone in his body. He will stop taking this and hopefull the lump will resolve. If this is still present in 4 weeks, he will return for a recheck.  Alysia Penna, MD

## 2022-11-22 ENCOUNTER — Telehealth: Payer: Self-pay | Admitting: Family Medicine

## 2022-11-22 NOTE — Telephone Encounter (Signed)
Pt wants to know if he should still refrain from taking saw palmetto, was discussed 11/15/2022 and says there have been no changes since he stopped taking it but he would like to resume

## 2022-11-23 NOTE — Telephone Encounter (Signed)
Yes he may resume taking it

## 2022-11-23 NOTE — Telephone Encounter (Signed)
Send MyChart message to pt, no option of leaving a message on pt mobile phone

## 2022-11-27 ENCOUNTER — Ambulatory Visit: Payer: No Typology Code available for payment source | Admitting: Family Medicine

## 2022-11-27 ENCOUNTER — Other Ambulatory Visit: Payer: Self-pay

## 2022-11-27 ENCOUNTER — Encounter: Payer: Self-pay | Admitting: Family Medicine

## 2022-11-27 VITALS — BP 120/80 | HR 75 | Temp 97.9°F | Wt 209.0 lb

## 2022-11-27 DIAGNOSIS — N6342 Unspecified lump in left breast, subareolar: Secondary | ICD-10-CM

## 2022-11-27 NOTE — Progress Notes (Signed)
   Subjective:    Patient ID: James [name], male    DOB: 1950/11/27, 72 y.o.   MRN: [id number]  HPI Here to follow up on a tender lump in the left breast that appeared about 5 weeks ago. It has not changed during this time. At our last visit we thought that the saw palmetto he was taking could be playing a role. So he stopped taking it. This has had no effect on the lump however.    Review of Systems  Constitutional: Negative.   Respiratory: Negative.    Cardiovascular: Negative.        Objective:   Physical Exam Constitutional:      Appearance: Normal appearance.  Cardiovascular:     Rate and Rhythm: Normal rate and regular rhythm.     Pulses: Normal pulses.     Heart sounds: Normal heart sounds.  Pulmonary:     Effort: Pulmonary effort is normal.     Breath sounds: Normal breath sounds.     Comments: There is a firm mobile tender lump in the left breast just behind the nipple. The left axilla is clear  Neurological:     Mental Status: He is alert.           Assessment & Plan:  Left breast lump. We will set up a diagnostic mammogram and an [place] to evaluate this.  Alysia Penna, MD

## 2022-12-11 ENCOUNTER — Ambulatory Visit: Payer: No Typology Code available for payment source

## 2022-12-11 ENCOUNTER — Ambulatory Visit
Admission: RE | Admit: 2022-12-11 | Discharge: 2022-12-11 | Disposition: A | Discharge status: Home / Self Care | Payer: No Typology Code available for payment source | Source: Ambulatory Visit | Attending: Family Medicine | Admitting: Family Medicine

## 2022-12-11 DIAGNOSIS — N6342 Unspecified lump in left breast, subareolar: Secondary | ICD-10-CM

## 2023-04-07 ENCOUNTER — Other Ambulatory Visit: Payer: Self-pay | Admitting: Family Medicine

## 2023-07-06 ENCOUNTER — Other Ambulatory Visit: Payer: Self-pay | Admitting: Family Medicine

## 2023-08-20 ENCOUNTER — Other Ambulatory Visit: Payer: Self-pay

## 2023-08-20 ENCOUNTER — Other Ambulatory Visit: Payer: Self-pay | Admitting: Family Medicine

## 2023-08-20 ENCOUNTER — Telehealth: Payer: Self-pay

## 2023-08-20 NOTE — Telephone Encounter (Signed)
Copied from CRM [id number]. Topic: Clinical - Prescription Issue >> Aug 20, 2023  1:36 PM James [name] wrote: Reason for CRM: Patient requesting refill on his losartan (COZAAR) 50 MG tablet due to forgetting it at home before leaving town for the holidays. He is requesting a 10 days script sent to the [facility] in [place], [place]. CRM [id number] resolved by Epic instead of routing to clinic.

## 2023-08-20 NOTE — Telephone Encounter (Signed)
Copied from CRM [id number]. Topic: Clinical - Medication Refill >> Aug 20, 2023  1:31 PM Corin V wrote: Most Recent Primary Care Visit:  Provider: Gershon Crane A  Department: [facility]  Visit Type: OFFICE VISIT  Date: 11/27/2022  Medication: losartan (COZAAR) 50 MG tablet  Has the patient contacted their pharmacy? No, patient flew out of town for the holidays and forget prescription at home. Requesting a 10 day fill on the Rx. (Agent: If no, request that the patient contact the pharmacy for the refill. If patient does not wish to contact the pharmacy document the reason why and proceed with request.) (Agent: If yes, when and what did the pharmacy advise?)  Is this the correct pharmacy for this prescription? Yes If no, delete pharmacy and type the correct one.  This is the patient's preferred pharmacy:  [facility]  [address] [place], [place] [address]  Has the prescription been filled recently? No  Is the patient out of the medication? Yes  Has the patient been seen for an appointment in the last year OR does the patient have an upcoming appointment? Yes  Can we respond through MyChart? No  Agent: Please be advised that Rx refills may take up to 3 business days. We ask that you follow-up with your pharmacy.

## 2023-08-23 ENCOUNTER — Other Ambulatory Visit: Payer: Self-pay

## 2023-08-23 MED ORDER — LOSARTAN POTASSIUM 50 MG PO TABS: 50.0000 mg | ORAL_TABLET | Freq: Every day | ORAL | 0 refills | Status: DC

## 2023-08-23 NOTE — Telephone Encounter (Signed)
Spoke with pt advised that Rx was sent to the pharmacy provided

## 2023-08-23 NOTE — Telephone Encounter (Signed)
Which [facility] in [place]?

## 2023-08-29 DIAGNOSIS — C61 Malignant neoplasm of prostate: Secondary | ICD-10-CM

## 2023-08-29 HISTORY — DX: Malignant neoplasm of prostate: C61

## 2023-10-05 ENCOUNTER — Other Ambulatory Visit: Payer: Self-pay | Admitting: Family Medicine

## 2023-10-10 ENCOUNTER — Telehealth: Payer: Self-pay

## 2023-10-10 NOTE — Telephone Encounter (Signed)
Copied from CRM [id number]. Topic: Clinical - Prescription Issue >> Oct 10, 2023  1:35 PM James [name] wrote: Reason for CRM: Patient called in to follow up on refill request. But pharmacy is reuqesting a follow up to be done

## 2023-10-18 ENCOUNTER — Other Ambulatory Visit: Payer: Self-pay | Admitting: Family Medicine

## 2023-10-18 NOTE — Telephone Encounter (Signed)
 Pt is calling and [facility] did not get refill request for losartan on 10-11-2023. Please resend and call pt

## 2023-10-19 ENCOUNTER — Telehealth: Payer: Self-pay | Admitting: Family Medicine

## 2023-10-19 ENCOUNTER — Other Ambulatory Visit: Payer: Self-pay

## 2023-10-19 MED ORDER — LOSARTAN POTASSIUM 50 MG PO TABS: 50.0000 mg | ORAL_TABLET | Freq: Every day | ORAL | 0 refills | Status: DC

## 2023-10-19 NOTE — Telephone Encounter (Signed)
 Copied from CRM [id number]. Topic: General - Call Back - No Documentation >> Oct 18, 2023  1:16 PM Corin V wrote: Reason for CRM: Patient is needing to speak to [facility] about a prescription. Please call him back to discuss issues with prescription and the pharmacy. He stated he was told earlier Nelva Bush will be able to handle the medication issues

## 2023-10-19 NOTE — Telephone Encounter (Signed)
 Pt is calling and need losartan (COZAAR) 50 MG tablet today please send to  [id number] - [place], [place] - [address] AT [facility] [address] Phone: [phone]  Fax: [phone]

## 2023-10-19 NOTE — Telephone Encounter (Signed)
 Done

## 2023-10-22 ENCOUNTER — Telehealth: Payer: Self-pay

## 2023-10-22 NOTE — Telephone Encounter (Signed)
 Copied from CRM [id number]. Topic: General - Call Back - No Documentation >> Oct 18, 2023  1:16 PM Corin V wrote: Reason for CRM: Patient is needing to speak to [facility] about a prescription. Please call him back to discuss issues with prescription and the pharmacy. He stated he was told earlier Nelva Bush will be able to handle the medication issues >> Oct 19, 2023 12:54 PM Tiffany H wrote: Patient called to follow up on request for Losartan refill. Spoke with Nelva Bush at [facility], who advised that RN is on lunch. Harriett Sine will refill prescription today. Line cut out. Attempted to call patient back but patient didn't answer. Left voicemail.

## 2023-10-24 NOTE — Telephone Encounter (Signed)
 Spoke with pt state that he picked up Rx from the pharmacy

## 2023-10-24 NOTE — Telephone Encounter (Signed)
 Pt picked up Rx from the pharmacy.

## 2024-01-15 ENCOUNTER — Other Ambulatory Visit: Payer: Self-pay | Admitting: Family Medicine
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
## 2024-04-02 ENCOUNTER — Encounter: Payer: Self-pay | Admitting: Family Medicine

## 2024-04-02 ENCOUNTER — Ambulatory Visit (INDEPENDENT_AMBULATORY_CARE_PROVIDER_SITE_OTHER)

## 2024-04-02 ENCOUNTER — Other Ambulatory Visit: Payer: Self-pay

## 2024-04-02 ENCOUNTER — Ambulatory Visit: Admitting: Family Medicine

## 2024-04-02 ENCOUNTER — Ambulatory Visit: Payer: Self-pay | Admitting: Family Medicine

## 2024-04-02 ENCOUNTER — Other Ambulatory Visit: Payer: Self-pay | Admitting: Family Medicine

## 2024-04-02 VITALS — BP 124/80 | HR 67 | Ht 70.0 in | Wt 214.0 lb

## 2024-04-02 DIAGNOSIS — R972 Elevated prostate specific antigen [PSA]: Secondary | ICD-10-CM

## 2024-04-02 DIAGNOSIS — Z96653 Presence of artificial knee joint, bilateral: Secondary | ICD-10-CM

## 2024-04-02 DIAGNOSIS — G8928 Other chronic postprocedural pain: Secondary | ICD-10-CM

## 2024-04-02 DIAGNOSIS — M25562 Pain in left knee: Secondary | ICD-10-CM

## 2024-04-02 DIAGNOSIS — M5030 Other cervical disc degeneration, unspecified cervical region: Secondary | ICD-10-CM | POA: Diagnosis not present

## 2024-04-02 DIAGNOSIS — M542 Cervicalgia: Secondary | ICD-10-CM

## 2024-04-02 DIAGNOSIS — M2550 Pain in unspecified joint: Secondary | ICD-10-CM | POA: Diagnosis not present

## 2024-04-02 DIAGNOSIS — M059 Rheumatoid arthritis with rheumatoid factor, unspecified: Secondary | ICD-10-CM | POA: Diagnosis not present

## 2024-04-02 DIAGNOSIS — M79642 Pain in left hand: Secondary | ICD-10-CM

## 2024-04-02 DIAGNOSIS — R7989 Other specified abnormal findings of blood chemistry: Secondary | ICD-10-CM

## 2024-04-02 DIAGNOSIS — G8929 Other chronic pain: Secondary | ICD-10-CM

## 2024-04-02 DIAGNOSIS — M25561 Pain in right knee: Secondary | ICD-10-CM | POA: Diagnosis not present

## 2024-04-02 LAB — COMPREHENSIVE METABOLIC PANEL WITH GFR
ALT: 27 U/L (ref 0–53)
AST: 28 U/L (ref 0–37)
Albumin: 4.3 g/dL (ref 3.5–5.2)
Alkaline Phosphatase: 111 U/L (ref 39–117)
BUN: 15 mg/dL (ref 6–23)
CO2: 26 meq/L (ref 19–32)
Calcium: 9.6 mg/dL (ref 8.4–10.5)
Chloride: 106 meq/L (ref 96–112)
Creatinine, Ser: 1 mg/dL (ref 0.40–1.50)
GFR: 74.05 mL/min (ref >60.00)
Glucose, Bld: 104 mg/dL — ABNORMAL HIGH (ref 70–99)
Potassium: 4.1 meq/L (ref 3.5–5.1)
Sodium: 140 meq/L (ref 135–145)
Total Bilirubin: 1.1 mg/dL (ref 0.2–1.2)
Total Protein: 6.4 g/dL (ref 6.0–8.3)

## 2024-04-02 LAB — IBC PANEL
Iron: 128 ug/dL (ref 42–165)
Saturation Ratios: 38.9 % (ref 20.0–50.0)
TIBC: 329 ug/dL (ref 250.0–450.0)
Transferrin: 235 mg/dL (ref 212.0–360.0)

## 2024-04-02 LAB — CBC WITH DIFFERENTIAL/PLATELET
Basophils Absolute: 0.1 K/uL (ref 0.0–0.1)
Basophils Relative: 0.9 % (ref 0.0–3.0)
Eosinophils Absolute: 0.2 K/uL (ref 0.0–0.7)
Eosinophils Relative: 2.6 % (ref 0.0–5.0)
HCT: 39.3 % (ref 39.0–52.0)
Hemoglobin: 13.6 g/dL (ref 13.0–17.0)
Lymphocytes Relative: 41.4 % (ref 12.0–46.0)
Lymphs Abs: 2.9 K/uL (ref 0.7–4.0)
MCHC: 34.5 g/dL (ref 30.0–36.0)
MCV: 97.9 fl (ref 78.0–100.0)
Monocytes Absolute: 0.8 K/uL (ref 0.1–1.0)
Monocytes Relative: 11.3 % (ref 3.0–12.0)
Neutro Abs: 3.1 K/uL (ref 1.4–7.7)
Neutrophils Relative %: 43.8 % (ref 43.0–77.0)
Platelets: 206 K/uL (ref 150.0–400.0)
RBC: 4.01 Mil/uL — ABNORMAL LOW (ref 4.22–5.81)
RDW: 12.1 % (ref 11.5–15.5)
WBC: 7 K/uL (ref 4.0–10.5)

## 2024-04-02 LAB — TESTOSTERONE: Testosterone: 193.19 ng/dL — ABNORMAL LOW (ref 300.00–890.00)

## 2024-04-02 LAB — FERRITIN: Ferritin: 156.8 ng/mL (ref 22.0–322.0)

## 2024-04-02 LAB — SEDIMENTATION RATE: Sed Rate: 4 mm/h (ref 0–20)

## 2024-04-02 LAB — TSH: TSH: 1.53 u[IU]/mL (ref 0.35–5.50)

## 2024-04-02 LAB — PSA: PSA: 6.59 ng/mL — ABNORMAL HIGH (ref 0.10–4.00)

## 2024-04-02 LAB — VITAMIN D 25 HYDROXY (VIT D DEFICIENCY, FRACTURES): VITD: 49.24 ng/mL (ref 30.00–100.00)

## 2024-04-02 LAB — URIC ACID: Uric Acid, Serum: 6.1 mg/dL (ref 4.0–7.8)

## 2024-04-02 LAB — C-REACTIVE PROTEIN: CRP: <1 mg/dL (ref 0.5–20.0)

## 2024-04-02 LAB — VITAMIN B12: Vitamin B-12: 352 pg/mL (ref 211–911)

## 2024-04-02 NOTE — Patient Instructions (Addendum)
 Scapular exercises Labs Xray PT [facility] Consider singulair and write us  Consider bone scan  See me 10-12 weeks

## 2024-04-02 NOTE — Assessment & Plan Note (Signed)
 X-rays pending, scapular exercises given, discussed icing regimen of home exercises, discussed which activities to do in which ones to avoid.  Increase activity slowly.  Discussed icing regimen.  Follow-up again in 6 to 8 weeks

## 2024-04-02 NOTE — Assessment & Plan Note (Signed)
 History of rheumatoid arthritis and could be a potential flare as well.  Discussed with patient about laboratory workup.  We will continue to monitor this.  Check sedimentation rate and other labs that could be contributing as well as deficiencies of different vitamins.

## 2024-04-02 NOTE — Addendum Note (Signed)
 Addended by: DESIDERIO SUSANNAH SAUNDERS on: 04/02/2024 03:58 PM   Modules accepted: Orders

## 2024-04-02 NOTE — Assessment & Plan Note (Signed)
 Unfortunately significant amount of effusion of the knees noted bilaterally that does limit some range of motion.  Patient does have audible clicking noted.  Seems to have some deformity noted on the left side.  Discussed with patient at great length about potential bone scans.  Patient would like to consider it and will decide.  May follow-up with surgeon as well.  Has been 4 years since replacement.  Labs ordered as well for chromium and cobalt

## 2024-04-04 LAB — PTH, INTACT AND CALCIUM
Calcium: 9.7 mg/dL (ref 8.6–10.3)
PTH: 78 pg/mL — ABNORMAL HIGH (ref 16–77)

## 2024-04-05 LAB — ANTI-NUCLEAR AB-TITER (ANA TITER): ANA Titer 1: 1:40 {titer} — ABNORMAL HIGH

## 2024-04-05 LAB — ANGIOTENSIN CONVERTING ENZYME: Angiotensin-Converting Enzyme: 40 U/L (ref 9–67)

## 2024-04-05 LAB — CYCLIC CITRUL PEPTIDE ANTIBODY, IGG: Cyclic Citrullin Peptide Ab: <16 U

## 2024-04-05 LAB — ANA: Anti Nuclear Antibody (ANA): POSITIVE — AB

## 2024-04-05 LAB — CALCIUM, IONIZED: Calcium, Ion: 5.2 mg/dL (ref 4.7–5.5)

## 2024-04-05 LAB — RHEUMATOID FACTOR: Rheumatoid fact SerPl-aCnc: <10 [IU]/mL (ref <14)

## 2024-04-05 LAB — CHROMIUM LEVEL: Chromium: 0.1 ug/L (ref <1.3)

## 2024-04-05 LAB — COBALT: Cobalt, Plasma: <0.5 ug/L (ref <1.9)

## 2024-04-11 ENCOUNTER — Other Ambulatory Visit: Payer: Self-pay | Admitting: Family Medicine

## 2024-05-10 ENCOUNTER — Other Ambulatory Visit: Payer: Self-pay | Admitting: Family Medicine

## 2024-05-13 ENCOUNTER — Other Ambulatory Visit: Payer: Self-pay | Admitting: Family Medicine

## 2024-05-23 ENCOUNTER — Other Ambulatory Visit: Payer: Self-pay | Admitting: Urology

## 2024-05-23 DIAGNOSIS — R972 Elevated prostate specific antigen [PSA]: Secondary | ICD-10-CM

## 2024-05-28 ENCOUNTER — Encounter: Payer: Self-pay | Admitting: Urology

## 2024-06-05 ENCOUNTER — Ambulatory Visit
Admission: RE | Admit: 2024-06-05 | Discharge: 2024-06-05 | Disposition: A | Discharge status: Home / Self Care | Source: Ambulatory Visit | Attending: Urology | Admitting: Urology

## 2024-06-05 DIAGNOSIS — R972 Elevated prostate specific antigen [PSA]: Secondary | ICD-10-CM

## 2024-06-05 MED ORDER — GADOPICLENOL 0.5 MMOL/ML IV SOLN
10.0000 mL | Freq: Once | INTRAVENOUS | Status: AC | PRN
  Administered 2024-06-05: 10 mL via INTRAVENOUS

## 2024-06-09 NOTE — Progress Notes (Unsigned)
 James [name] James [name] [address] [place] [address] Phone: [phone] Subjective:   ISusannah [name], am serving as a scribe for Dr. Arthea Claudene.  I'm seeing this patient by the request  of:  James Garnette LABOR, MD  CC: Multiple joint complaints  YEP:Dlagzrupcz  04/02/2024 Unfortunately significant amount of effusion of the knees noted bilaterally that does limit some range of motion.  Patient does have audible clicking noted.  Seems to have some deformity noted on the left side.  Discussed with patient at great length about potential bone scans.  Patient would like to consider it and will decide.  May follow-up with surgeon as well.  Has been 4 years since replacement.  Labs ordered as well for chromium and cobalt     Update 06/10/2024 James [name] is a 73 y.o. male coming in with complaint of L hand, B knee, and cervical spine pain. Patient states having B knee pain and has been in therapy for neck. Following up on lab results. Has some clicking in neck with extension with rotation.   Reviewing patient's imaging did have an MRI after we did refer him to urology for elevated PSA and found to have a lesion.  Now we are awaiting scheduling for biopsy    Past Medical History:  Diagnosis Date   Anterior basement membrane dystrophy    sees Dr. 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 Milan    COLONOSCOPY  09/10/2019   per Dr. Teressa, adenimatous polyp, repeat in 7 yrs   EYE SURGERY Left    laser procedure   sqamous cell     right eyebrow excised    SUPERFICIAL KERATECTOMY Right 2015   per Dr. Deacon     Social History   Socioeconomic History   Marital status: Married    Spouse name: Not on file   Number of children: Not on file   Years of education: Not on file   Highest education level: Not on file  Occupational History   Not on file  Tobacco Use   Smoking status: Former    Current packs/day: 0.25    Types: Cigarettes   Smokeless tobacco: Never   Tobacco comments:    quit on January 1st  Vaping Use   Vaping status: Never Used  Substance and Sexual Activity   Alcohol use: Yes    Alcohol/week: 0.0 standard drinks of alcohol    Comment: rare   Drug use: No   Sexual activity: Not on file  Other Topics Concern   Not on file  Social History Narrative   Not on file   Social Drivers of Health   Financial Resource Strain: Not on file  Food Insecurity: Not on file  Transportation Needs: Not on file  Physical Activity: Not on file  Stress: Not on file  Social Connections: Unknown (01/09/2022)   Received [facility]   Social Network    Social Network: Not on file   Allergies  Allergen Reactions   Penicillins  Respiratory reaction SOB   Family History  Problem Relation Age of Onset   Uterine cancer Mother    Breast cancer Mother 2   Colon cancer Neg Hx    Colon polyps Neg Hx    Esophageal cancer Neg Hx    Rectal cancer Neg Hx    Stomach cancer Neg Hx      Current Outpatient Medications (Cardiovascular):    losartan  (COZAAR ) 50 MG tablet, TAKE 1 TABLET BY MOUTH DAILY  Current Outpatient Medications (Respiratory):    albuterol  (VENTOLIN  HFA) 108 (90 Base) MCG/ACT inhaler, Inhale 2 puffs into the lungs.  Current Outpatient Medications (Analgesics):    ibuprofen (ADVIL) 600 MG tablet, Take 600 mg by mouth daily at 12 noon.   Current Outpatient Medications (Other):    cholecalciferol (VITAMIN D ) 1000 units tablet, Take 1,000 Units by mouth daily.   FIBER PO, Take by mouth daily. Organic fiber   Garlic 10 MG CAPS, Take by mouth daily.    Glucosamine 500 MG CAPS, Take by mouth.   NON FORMULARY, Takes organic vitamins   OVER THE COUNTER MEDICATION, at bedtime. Tarte cherry   Saw Palmetto, Serenoa repens, (SAW PALMETTO PO), Take 320 mg by mouth daily at 12 noon.   TURMERIC PO, Take 1,000 mg by mouth daily.   Reviewed prior external information including notes and imaging from  primary care provider As well as notes that were available from care everywhere and other healthcare systems.  Past medical history, social, surgical and family history all reviewed in electronic medical record.  No pertanent information unless stated regarding to the chief complaint.   Review of Systems:  No headache, visual changes, nausea, vomiting, diarrhea, constipation, dizziness, abdominal pain, skin rash, fevers, chills, night sweats, weight loss, swollen lymph nodes, body aches, joint swelling, chest pain, shortness of breath, mood changes. POSITIVE muscle aches  Objective  Blood pressure 122/78, pulse 74, height 5' 10 (1.778 m), weight 214 lb (97.1 kg), SpO2 98%.   General: No apparent distress alert and oriented x3 mood and affect normal, dressed appropriately.  HEENT: Pupils equal, extraocular movements intact  Respiratory: Patient's speak in full sentences and does not appear short of breath  Cardiovascular: No lower extremity edema, non tender, no erythema  Knees do have some arthritic changes noted.  Some mild crepitus noted.  Some tenderness to palpation in the neck as well but patient does have some crepitus with extension of the neck with rotation.  No radicular symptoms.    Impression and Recommendations:    The above documentation has been reviewed and is accurate and complete James [name] M James Coddington, DO

## 2024-06-11 ENCOUNTER — Ambulatory Visit: Admitting: Family Medicine

## 2024-06-11 VITALS — BP 122/78 | HR 74 | Ht 70.0 in | Wt 214.0 lb

## 2024-06-11 DIAGNOSIS — M5030 Other cervical disc degeneration, unspecified cervical region: Secondary | ICD-10-CM | POA: Diagnosis not present

## 2024-06-11 NOTE — Assessment & Plan Note (Addendum)
 Doing well with physical therapy.  At this point would not want to make any other significant changes.  Discussed icing regimen and home exercises, discussed which activities to do and which ones to avoid.  Increase activity slowly.  Will make no other big changes at this moment.  Will follow-up with me again in 6 to 8 weeks I personally spent a total of 31 minutes in the care of the patient today including preparing to see the patient, getting/reviewing separately obtained history, performing a medically appropriate exam/evaluation, counseling and educating, referring and communicating with other health care professionals, documenting clinical information in the EHR, communicating results, and coordinating care.

## 2024-06-11 NOTE — Patient Instructions (Signed)
 Get that drum set Think about shockwave for thumb Tell PT not to extend and rotate neck at the same time Continue to strengthen upper back See you again in 2 months

## 2024-06-18 ENCOUNTER — Other Ambulatory Visit: Payer: Self-pay | Admitting: Family Medicine

## 2024-06-18 NOTE — Telephone Encounter (Unsigned)
 Copied from CRM [id number]. Topic: Clinical - Medication Refill >> Jun 18, 2024  9:34 AM Viola F wrote: Medication: 90 DAY SUPPLY losartan  (COZAAR ) 50 MG tablet [[id number]]  Has the patient contacted their pharmacy? Yes (Agent: If no, request that the patient contact the pharmacy for the refill. If patient does not wish to contact the pharmacy document the reason why and proceed with request.) (Agent: If yes, when and what did the pharmacy advise?)  This is the patient's preferred pharmacy:  [id number] - [place], [place] - [address] AT [facility] [address] [place] [address] Phone: [phone] Fax: [phone]  Is this the correct pharmacy for this prescription? Yes If no, delete pharmacy and type the correct one.   Has the prescription been filled recently? Yes  Is the patient out of the medication? No  Has the patient been seen for an appointment in the last year OR does the patient have an upcoming appointment? Yes  Can we respond through MyChart? Yes  Agent: Please be advised that Rx refills may take up to 3 business days. We ask that you follow-up with your pharmacy.

## 2024-06-19 ENCOUNTER — Other Ambulatory Visit: Payer: Self-pay | Admitting: Family Medicine

## 2024-06-19 MED ORDER — LOSARTAN POTASSIUM 50 MG PO TABS: 50.0000 mg | ORAL_TABLET | Freq: Every day | ORAL | 0 refills | Status: DC

## 2024-07-19 ENCOUNTER — Other Ambulatory Visit: Payer: Self-pay | Admitting: Family Medicine

## 2024-07-22 ENCOUNTER — Ambulatory Visit (INDEPENDENT_AMBULATORY_CARE_PROVIDER_SITE_OTHER): Admitting: Family Medicine

## 2024-07-22 ENCOUNTER — Encounter: Payer: Self-pay | Admitting: Family Medicine

## 2024-07-22 VITALS — BP 124/78 | HR 72 | Temp 98.5°F | Ht 69.0 in | Wt 214.0 lb

## 2024-07-22 DIAGNOSIS — Z0000 Encounter for general adult medical examination without abnormal findings: Secondary | ICD-10-CM

## 2024-07-22 DIAGNOSIS — C61 Malignant neoplasm of prostate: Secondary | ICD-10-CM | POA: Insufficient documentation

## 2024-07-22 LAB — HEMOGLOBIN A1C: Hgb A1c MFr Bld: 5.9 % (ref 4.6–6.5)

## 2024-07-22 LAB — LIPID PANEL
Cholesterol: 177 mg/dL (ref 0–200)
HDL: 38.2 mg/dL — ABNORMAL LOW (ref >39.00)
LDL Cholesterol: 103 mg/dL — ABNORMAL HIGH (ref 0–99)
NonHDL: 139.29
Total CHOL/HDL Ratio: 5
Triglycerides: 180 mg/dL — ABNORMAL HIGH (ref 0.0–149.0)
VLDL: 36 mg/dL (ref 0.0–40.0)

## 2024-07-22 MED ORDER — LOSARTAN POTASSIUM 50 MG PO TABS: 50.0000 mg | ORAL_TABLET | Freq: Every day | ORAL | 3 refills | Status: AC

## 2024-07-22 NOTE — Progress Notes (Signed)
 Subjective:    Patient ID: James [name], male    DOB: 12/08/1950, 73 y.o.   MRN: [id number]  HPI Here for a well exam. He has no complaints today. He saw Dr. Claudene on 04-02-24 about his joint pains, and a variety of lab tests were ordered. These included a PSA, which came back elevated at 6.59. he was referred to Urology, and he has been seeing Dr. Devere. He had an MRI and biopsy recently that revealed prostate cancer. He is now scheduled to meet with Radiation Oncology to discuss a treatment plan. His BP has been stable.    Review of Systems  Constitutional: Negative.   HENT: Negative.    Eyes: Negative.   Respiratory: Negative.    Cardiovascular: Negative.   Gastrointestinal: Negative.   Genitourinary: Negative.   Musculoskeletal:  Positive for arthralgias.  Skin: Negative.   Neurological: Negative.   Psychiatric/Behavioral: Negative.         Objective:   Physical Exam Constitutional:      General: He is not in acute distress.    Appearance: He is well-developed. He is obese. He is not diaphoretic.  HENT:     Head: Normocephalic and atraumatic.     Right Ear: External ear normal.     Left Ear: External ear normal.     Nose: Nose normal.     Mouth/Throat:     Pharynx: No oropharyngeal exudate.  Eyes:     General: No scleral icterus.       Right eye: No discharge.        Left eye: No discharge.     Conjunctiva/sclera: Conjunctivae normal.     Pupils: Pupils are equal, round, and reactive to light.  Neck:     Thyroid : No thyromegaly.     Vascular: No JVD.     Trachea: No tracheal deviation.  Cardiovascular:     Rate and Rhythm: Normal rate and regular rhythm.     Pulses: Normal pulses.     Heart sounds: Normal heart sounds. No murmur heard.    No friction rub. No gallop.  Pulmonary:     Effort: Pulmonary effort is normal. No respiratory distress.     Breath sounds: Normal breath sounds. No wheezing or rales.  Chest:     Chest wall: No tenderness.   Abdominal:     General: Bowel sounds are normal. There is no distension.     Palpations: Abdomen is soft. There is no mass.     Tenderness: There is no abdominal tenderness. There is no guarding or rebound.  Genitourinary:    Penis: No tenderness.   Musculoskeletal:        General: No tenderness. Normal range of motion.     Cervical back: Neck supple.  Lymphadenopathy:     Cervical: No cervical adenopathy.  Skin:    General: Skin is warm and dry.     Coloration: Skin is not pale.     Findings: No erythema or rash.  Neurological:     General: No focal deficit present.     Mental Status: He is alert and oriented to person, place, and time.     Cranial Nerves: No cranial nerve deficit.     Motor: No abnormal muscle tone.     Coordination: Coordination normal.     Deep Tendon Reflexes: Reflexes are normal and symmetric. Reflexes normal.  Psychiatric:        Mood and Affect: Mood normal.  Behavior: Behavior normal.        Thought Content: Thought content normal.        Judgment: Judgment normal.           Assessment & Plan:  Well exam. We discussed diet and exercise. We will check an A1c and a lipid panel today. He will follow up with Dr. Devere for the prostate cancer. Garnette Olmsted, MD

## 2024-07-23 ENCOUNTER — Telehealth: Payer: Self-pay

## 2024-07-23 ENCOUNTER — Ambulatory Visit: Payer: Self-pay | Admitting: Family Medicine

## 2024-07-23 NOTE — Telephone Encounter (Signed)
 Copied from CRM #[id number]. Topic: Clinical - Prescription Issue >> Jul 23, 2024  3:39 PM Alexandria E wrote: Reason for CRM: Patient has been having trouble receiving his losartan  (COZAAR ) 50 MG tablet. Told patient that a 90-day supply was sent in yesterday and the pharmacy confirmed the receipt on 11/25 as well. Patient stated that they recently have been giving him just a one month supply at a time and the pharmacy called him this morning stating he cannot pick it up as it is too early to fill, even though the start date for that medication is listed as 07/22/2024.

## 2024-07-30 NOTE — Telephone Encounter (Signed)
 Called pt pharmacy on hold for a long time with no success of speaking with an agent, will try again later

## 2024-08-05 ENCOUNTER — Other Ambulatory Visit (HOSPITAL_COMMUNITY): Payer: Self-pay | Admitting: Urology

## 2024-08-05 DIAGNOSIS — C61 Malignant neoplasm of prostate: Secondary | ICD-10-CM

## 2024-08-12 NOTE — Progress Notes (Signed)
 GU Location of Tumor / Histology:  Prostate Ca  If Prostate Cancer, Gleason Score is (4 + 3) and PSA is (6.59 Oon8/2025)  James [name] presented as referral from Dr. Lonni Han [facility]) elevated PSA.  Biopsies       08/18/2024 Dr. Lonni Winter NM PET (PSMA) Skull to Mid Thigh CLINICAL DATA:    06/05/2024 Dr. Lonni Han MR Prostate with/without Contrast CLINICAL DATA:  Elevated PSA; PSA 6.59 on 03/2024; occasional urgency/frequency ; NKI. No prior sx. Hx of Squamous cell ca; Pt did not do prep. Pt did void prior to scan; 10 mL Vueway  administered out of 10 mL Vial, 0 mL wasted.  IMPRESSION: 1. PI-RADS 4 lesion in the posterior medial right mid gland peripheral zone measuring 12 x 9 mm. ROI #1. 2. Estimated prostate volume: 19.8 mL.   Past/Anticipated interventions by urology, if any:  Dr. Lonni Han    Past/Anticipated interventions by medical oncology, if any: NA  Weight changes, if any: {:18581}  IPSS: SHIM:  Bowel/Bladder complaints, if any: {:18581}   Nausea/Vomiting, if any: {:18581}  Pain issues, if any:  {:18581}  SAFETY ISSUES: Prior radiation? {:18581} Pacemaker/ICD? {:18581} Possible current pregnancy? Male Is the patient on methotrexate? No  Current Complaints / other details:    30 minutes spent total, including time for meaningful use questions, reviewing medication, as well as spent in face-to-face time in nurse evaluation with the patient.

## 2024-08-12 NOTE — Telephone Encounter (Signed)
 Spoke with pt state that his Losartan  Rx issue got fixed by the pharmacy

## 2024-08-12 NOTE — Progress Notes (Unsigned)
 [name] [address] [place] [address] Phone: [phone] Subjective:   [name], am serving as a scribe for Dr. Arthea Claudene.  I'm seeing this patient by the request  of:  Johnny Garnette LABOR, MD  CC: Cervical neck pain follow-up  YEP:Dlagzrupcz  06/11/2024 Doing well with physical therapy.  At this point would not want to make any other significant changes.  Discussed icing regimen and home exercises, discussed which activities to do and which ones to avoid.  Increase activity slowly.  Will make no other big changes at this moment.  Will follow-up with me again in 6 to 8 weeks I personally spent a total of 31 minutes in the care of the patient today including preparing to see the patient, getting/reviewing separately obtained history, performing a medically appropriate exam/evaluation, counseling and educating, referring and communicating with other health care professionals, documenting clinical information in the EHR, communicating results, and coordinating care.      Update 08/13/2024 CASTEN FLOREN is a 73 y.o. male coming in with complaint of cervical spine pain. Patient states that his neck pain has improved significantly. Continuing PT for once a week.    Patient's last imaging was an MRI of the prostate showing a lesion.  Scheduled for a PET scan in next week    Past Medical History:  Diagnosis Date   Anterior basement membrane dystrophy    sees Dr. 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 Milan    COLONOSCOPY  09/10/2019   per Dr. Teressa, adenimatous polyp, repeat in 7 yrs   EYE  SURGERY Left    laser procedure   sqamous cell     right eyebrow excised    SUPERFICIAL KERATECTOMY Right 2015   per Dr. Deacon    Social History   Socioeconomic History   Marital status: Married    Spouse name: Not on file   Number of children: Not on file   Years of education: Not on file   Highest education level: Not on file  Occupational History   Not on file  Tobacco Use   Smoking status: Former    Current packs/day: 0.25    Types: Cigarettes   Smokeless tobacco: Never   Tobacco comments:    quit on January 1st  Vaping Use   Vaping status: Never Used  Substance and Sexual Activity   Alcohol use: Yes    Alcohol/week: 0.0 standard drinks of alcohol    Comment: rare   Drug use: No   Sexual activity: Not on file  Other Topics Concern   Not on file  Social History Narrative   Not on file   Social Drivers of Health   Tobacco Use: Medium Risk (07/22/2024)   Patient History    Smoking Tobacco Use: Former    Smokeless Tobacco Use: Never    Passive Exposure: Not on Actuary Strain: Not on file  Food Insecurity: Not on file  Transportation Needs: Not on file  Physical  Activity: Not on file  Stress: Not on file  Social Connections: Unknown (01/09/2022)   Received from [facility]   Social Network    Social Network: Not on file  Depression (PHQ2-9): Low Risk (10/17/2022)   Depression (PHQ2-9)    PHQ-2 Score: 3  Alcohol Screen: Not on file  Housing: Not on file  Utilities: Not on file  Health Literacy: Not on file   Allergies[1] Family History  Problem Relation Age of Onset   Uterine cancer Mother    Breast cancer Mother 58   Colon cancer Neg Hx    Colon polyps Neg Hx    Esophageal cancer Neg Hx    Rectal cancer Neg Hx    Stomach cancer Neg Hx     Current Outpatient Medications (Cardiovascular):    losartan  (COZAAR ) 50 MG tablet, Take 1 tablet (50 mg total) by mouth daily.  Current Outpatient Medications (Respiratory):     albuterol  (VENTOLIN  HFA) 108 (90 Base) MCG/ACT inhaler, Inhale 2 puffs into the lungs.  Current Outpatient Medications (Analgesics):    Acetaminophen (TYLENOL ARTHRITIS PAIN PO), Take 1,500 mg by mouth as needed.  Current Outpatient Medications (Other):    cholecalciferol (VITAMIN D ) 1000 units tablet, Take 1,000 Units by mouth daily.   FIBER PO, Take by mouth daily. Organic fiber   Garlic 10 MG CAPS, Take by mouth daily.   Glucosamine 500 MG CAPS, Take by mouth.   NON FORMULARY, Takes organic vitamins   OVER THE COUNTER MEDICATION, at bedtime. Tarte cherry   Saw Palmetto, Serenoa repens, (SAW PALMETTO PO), Take 320 mg by mouth daily at 12 noon.   TURMERIC PO, Take 1,000 mg by mouth daily.   Reviewed prior external information including notes and imaging from  primary care provider As well as notes that were available from care everywhere and other healthcare systems.  Past medical history, social, surgical and family history all reviewed in electronic medical record.  No pertanent information unless stated regarding to the chief complaint.   Review of Systems:  No headache, visual changes, nausea, vomiting, diarrhea, constipation, dizziness, abdominal pain, skin rash, fevers, chills, night sweats, weight loss, swollen lymph nodes, body aches, joint swelling, chest pain, shortness of breath, mood changes. POSITIVE muscle aches  Objective  Blood pressure 120/74, pulse 81, height 5' 9 (1.753 m), weight 215 lb (97.5 kg), SpO2 98%.   General: No apparent distress alert and oriented x3 mood and affect normal, dressed appropriately.  HEENT: Pupils equal, extraocular movements intact  Respiratory: Patient's speak in full sentences and does not appear short of breath  Cardiovascular: No lower extremity edema, non tender, no erythema  Neck exam shows some mild loss of lordosis.  Some tenderness to palpation over the medial aspect of the neck but no midline tenderness noted. Bilateral knees  do have arthritic changes noted.  Tenderness to palpation over the medial joint line.  Patient does have instability noted on the left knee.  Significant instability with valgus and varus force. Right knee instability noted.   Impression and Recommendations:     The above documentation has been reviewed and is accurate and complete Arthea CHRISTELLA Sharps, DO       [1]  Allergies Allergen Reactions   Penicillins     Respiratory reaction SOB

## 2024-08-13 ENCOUNTER — Ambulatory Visit

## 2024-08-13 ENCOUNTER — Telehealth: Payer: Self-pay | Admitting: Family Medicine

## 2024-08-13 ENCOUNTER — Ambulatory Visit: Admitting: Family Medicine

## 2024-08-13 VITALS — BP 120/74 | HR 81 | Ht 69.0 in | Wt 215.0 lb

## 2024-08-13 DIAGNOSIS — G8928 Other chronic postprocedural pain: Secondary | ICD-10-CM

## 2024-08-13 DIAGNOSIS — Z96653 Presence of artificial knee joint, bilateral: Secondary | ICD-10-CM

## 2024-08-13 DIAGNOSIS — C61 Malignant neoplasm of prostate: Secondary | ICD-10-CM | POA: Diagnosis not present

## 2024-08-13 DIAGNOSIS — M542 Cervicalgia: Secondary | ICD-10-CM

## 2024-08-13 DIAGNOSIS — M25562 Pain in left knee: Secondary | ICD-10-CM

## 2024-08-13 DIAGNOSIS — M25561 Pain in right knee: Secondary | ICD-10-CM

## 2024-08-13 NOTE — Patient Instructions (Signed)
 R knee xray  L knee xray Cspine xray Custom OA stab L Custom OA stab R side lower profile See me again in 3 months

## 2024-08-13 NOTE — Telephone Encounter (Signed)
 Scheduled patient with Dr. Joane for shockwave

## 2024-08-13 NOTE — Assessment & Plan Note (Signed)
 X-rays ordered today.  We can consider the possibility of bone scan but patient would not want any surgical intervention.  Due to the instability of the especially of the left knee do feel that an early stability brace would be beneficial.  Questionable medial unloader brace on the contralateral side patient is ambulatory and does have an abnormal thigh to calf ratio noted.  Patient can follow-up with me again in 2 to 3 months otherwise.

## 2024-08-13 NOTE — Assessment & Plan Note (Signed)
Awaiting PET scan.

## 2024-08-13 NOTE — Telephone Encounter (Signed)
 Patient would like to know if he needs to set up another appt for the bone spur in his thumb or if the march appt is okay. Please advise.

## 2024-08-18 ENCOUNTER — Encounter (HOSPITAL_COMMUNITY)
Admission: RE | Admit: 2024-08-18 | Discharge: 2024-08-18 | Disposition: A | Discharge status: Home / Self Care | Source: Ambulatory Visit | Attending: Urology | Admitting: Urology

## 2024-08-18 DIAGNOSIS — C61 Malignant neoplasm of prostate: Secondary | ICD-10-CM | POA: Insufficient documentation

## 2024-08-18 MED ORDER — FLOTUFOLASTAT F 18 GALLIUM 296-5846 MBQ/ML IV SOLN
8.0000 | Freq: Once | INTRAVENOUS | Status: AC
  Administered 2024-08-18: 8.63 via INTRAVENOUS
  Filled 2024-08-18: qty 8

## 2024-08-26 ENCOUNTER — Ambulatory Visit
Admission: RE | Admit: 2024-08-26 | Discharge: 2024-08-26 | Disposition: A | Discharge status: Home / Self Care | Source: Ambulatory Visit | Attending: Radiation Oncology | Admitting: Radiation Oncology

## 2024-08-26 ENCOUNTER — Encounter: Payer: Self-pay | Admitting: Radiation Oncology

## 2024-08-26 ENCOUNTER — Ambulatory Visit: Payer: Self-pay | Admitting: Family Medicine

## 2024-08-26 VITALS — BP 137/82 | HR 75 | Temp 97.1°F | Resp 18 | Ht 69.0 in | Wt 214.2 lb

## 2024-08-26 DIAGNOSIS — Z87891 Personal history of nicotine dependence: Secondary | ICD-10-CM | POA: Insufficient documentation

## 2024-08-26 DIAGNOSIS — M1990 Unspecified osteoarthritis, unspecified site: Secondary | ICD-10-CM | POA: Diagnosis not present

## 2024-08-26 DIAGNOSIS — J45909 Unspecified asthma, uncomplicated: Secondary | ICD-10-CM | POA: Insufficient documentation

## 2024-08-26 DIAGNOSIS — Z85828 Personal history of other malignant neoplasm of skin: Secondary | ICD-10-CM | POA: Insufficient documentation

## 2024-08-26 DIAGNOSIS — C61 Malignant neoplasm of prostate: Secondary | ICD-10-CM

## 2024-08-26 DIAGNOSIS — R590 Localized enlarged lymph nodes: Secondary | ICD-10-CM

## 2024-08-26 HISTORY — DX: Malignant neoplasm of prostate: C61

## 2024-08-26 NOTE — Progress Notes (Signed)
 " Radiation Oncology         [phone] ________________________________  Initial Outpatient Consultation  Name: James [name] MRN: [id number]  Date: 08/26/2024  DOB: Jun 19, 1951  CC:Fry, Garnette LABOR, MD  Devere Lonni Righter, MD   REFERRING PHYSICIAN: Devere Lonni Righter, MD  DIAGNOSIS: 73 y.o. gentleman with Stage T1c adenocarcinoma of the prostate with Gleason score of 4+3, and PSA of 6.59.    ICD-10-CM   1. Malignant neoplasm of prostate [facility])  C61       HISTORY OF PRESENT ILLNESS: James [name] is a 73 y.o. male with a diagnosis of prostate cancer. He was noted to have an elevated PSA of 6.59 by his primary care physician, Dr. Claudene.  Accordingly, he was referred for evaluation in urology by Dr. Devere on 05/05/24. He underwent a prostate MRI on 06/05/24 showing: PI-RADS 4 lesion in posterior medial right mid gland peripheral zone. The patient proceeded to transrectal ultrasound with 12 biopsies of the prostate on 07/14/24.  The prostate volume measured 26 cc.  Out of 13 core biopsies, 5 were positive.  The maximum Gleason score was 4+3, and this was seen in right base, the MRI ROI, and right mid. Additionally, Gleason 3+4 was seen in right lateral mid and right lateral base.  He underwent a staging PSMA PET scan on 08/18/24 showing a 1.5 cm dominant intraprostatic lesion (DIL) on the right that correlates with MRI and biopsy findings as well as a tracer-avid node within the right hilar region with an SUV max of 7.3. No tracer-avid bone metastases were identified.     The patient reviewed the biopsy results with his urologist and he has kindly been referred today for discussion of potential radiation treatment options.  PREVIOUS RADIATION THERAPY: No  PAST MEDICAL HISTORY:  Past Medical History:  Diagnosis Date   Anterior basement membrane dystrophy    sees Dr. 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 Milan    COLONOSCOPY  09/10/2019   per Dr. Teressa, adenimatous polyp, repeat in 7 yrs   EYE SURGERY Left    laser procedure   sqamous cell     right eyebrow excised    SUPERFICIAL KERATECTOMY Right 2015   per Dr. Deacon     FAMILY HISTORY:  Family History  Problem Relation Age of Onset   Uterine cancer Mother    Breast cancer Mother 16   Colon cancer Neg Hx    Colon polyps Neg Hx    Esophageal cancer Neg Hx    Rectal cancer Neg Hx    Stomach cancer Neg Hx     SOCIAL HISTORY:  Social History   Socioeconomic History   Marital status: Married    Spouse name: Not on file   Number of children: Not on file   Years of education: Not on file   Highest education level: Not on file  Occupational History   Not on file  Tobacco Use  Smoking status: Former    Current packs/day: 0.25    Types: Cigarettes   Smokeless tobacco: Never   Tobacco comments:    quit on January 1st  Vaping Use   Vaping status: Never Used  Substance and Sexual Activity   Alcohol use: Yes    Alcohol/week: 1.0 standard drink of alcohol    Types: 1 Standard drinks or equivalent per week    Comment: cocktail in evenings   Drug use: No   Sexual activity: Not on file  Other Topics Concern   Not on file  Social History Narrative   Not on file   Social Drivers of Health   Tobacco Use: Medium Risk (08/26/2024)   Patient History    Smoking Tobacco Use: Former    Smokeless Tobacco Use: Never    Passive Exposure: Not on Actuary Strain: Not on file  Food Insecurity: No Food Insecurity (08/26/2024)   Epic    Worried About Programme Researcher, Broadcasting/film/video in  the Last Year: Never true    Ran Out of Food in the Last Year: Never true  Transportation Needs: No Transportation Needs (08/26/2024)   Epic    Lack of Transportation (Medical): No    Lack of Transportation (Non-Medical): No  Physical Activity: Not on file  Stress: Not on file  Social Connections: Unknown (01/09/2022)   [facility]   Social Network    Social Network: Not on file  Intimate Partner Violence: Not At Risk (08/26/2024)   Epic    Fear of Current or Ex-Partner: No    Emotionally Abused: No    Physically Abused: No    Sexually Abused: No  Depression (PHQ2-9): Low Risk (08/26/2024)   Depression (PHQ2-9)    PHQ-2 Score: 0  Alcohol Screen: Low Risk (08/26/2024)   Alcohol Screen    Last Alcohol Screening Score (AUDIT): 1  Housing: Low Risk (08/26/2024)   Epic    Unable to Pay for Housing in the Last Year: No    Number of Times Moved in the Last Year: 0    Homeless in the Last Year: No  Utilities: Not At Risk (08/26/2024)   Epic    Threatened with loss of utilities: No  Health Literacy: Not on file    ALLERGIES: Penicillins  MEDICATIONS:  Current Outpatient Medications  Medication Sig Dispense Refill   Acetaminophen (TYLENOL ARTHRITIS PAIN PO) Take 1,500 mg by mouth as needed.     albuterol  (VENTOLIN  HFA) 108 (90 Base) MCG/ACT inhaler Inhale 2 puffs into the lungs.     cholecalciferol (VITAMIN D ) 1000 units tablet Take 1,000 Units by mouth daily.     FIBER PO Take by mouth daily. Organic fiber     Garlic 10 MG CAPS Take by mouth daily.     Glucosamine 500 MG CAPS Take by mouth.     losartan  (COZAAR ) 50 MG tablet Take 1 tablet (50 mg total) by mouth daily. 90 tablet 3   NON FORMULARY Takes organic vitamins     OVER THE COUNTER MEDICATION at bedtime. Tarte cherry     Saw Palmetto, Serenoa repens, (SAW PALMETTO PO) Take 320 mg by mouth daily at 12 noon.     TURMERIC PO Take 1,000 mg by mouth daily.     No current facility-administered medications  for this encounter.    REVIEW OF SYSTEMS:  On review of systems, the patient reports that he is doing well overall. He denies any chest pain, shortness of  breath, cough, fevers, chills, night sweats, unintended weight changes. He denies any bowel disturbances, and denies abdominal pain, nausea or vomiting. He denies any new musculoskeletal or joint aches or pains. His IPSS was 5, indicating mild urinary symptoms. His SHIM was 7, indicating he has severe erectile dysfunction. A complete review of systems is obtained and is otherwise negative.    PHYSICAL EXAM:  Wt Readings from Last 3 Encounters:  08/26/24 214 lb 4 oz (97.2 kg)  08/13/24 215 lb (97.5 kg)  07/22/24 214 lb (97.1 kg)   Temp Readings from Last 3 Encounters:  08/26/24 (!) 97.1 F (36.2 C) (Temporal)  07/22/24 98.5 F (36.9 C) (Oral)  11/27/22 97.9 F (36.6 C) (Oral)   BP Readings from Last 3 Encounters:  08/26/24 137/82  08/13/24 120/74  07/22/24 124/78   Pulse Readings from Last 3 Encounters:  08/26/24 75  08/13/24 81  07/22/24 72   Pain Assessment Pain Score: 3  Pain Loc: Knee (both)/10  In general this is a well appearing Caucasian male in no acute distress. He's alert and oriented x4 and appropriate throughout the examination. Cardiopulmonary assessment is negative for acute distress, and he exhibits normal effort.     KPS = 100  100 - Normal; no complaints; no evidence of disease. 90   - Able to carry on normal activity; minor signs or symptoms of disease. 80   - Normal activity with effort; some signs or symptoms of disease. 27   - Cares for self; unable to carry on normal activity or to do active work. 60   - Requires occasional assistance, but is able to care for most of his personal needs. 50   - Requires considerable assistance and frequent medical care. 40   - Disabled; requires special care and assistance. 30   - Severely disabled; hospital admission is indicated although death not imminent. 20    - Very sick; hospital admission necessary; active supportive treatment necessary. 10   - Moribund; fatal processes progressing rapidly. 0     - Dead  Karnofsky DA, Abelmann WH, Craver LS and Burchenal JH [phone]) The use of the nitrogen mustards in the palliative treatment of carcinoma: with particular reference to bronchogenic carcinoma Cancer 1 634-56  LABORATORY DATA:  Lab Results  Component Value Date   WBC 7.0 04/02/2024   HGB 13.6 04/02/2024   HCT 39.3 04/02/2024   MCV 97.9 04/02/2024   PLT 206.0 04/02/2024   Lab Results  Component Value Date   NA 140 04/02/2024   K 4.1 04/02/2024   CL 106 04/02/2024   CO2 26 04/02/2024   Lab Results  Component Value Date   ALT 27 04/02/2024   AST 28 04/02/2024   ALKPHOS 111 04/02/2024   BILITOT 1.1 04/02/2024     RADIOGRAPHY: DG Knee AP/LAT W/Sunrise Left Result Date: 08/25/2024 EXAM: 3 VIEW(S) XRAY OF THE LEFT KNEE 08/13/2024 11:49:04 AM COMPARISON: 04/05/2020 CLINICAL HISTORY: L knee pain FINDINGS: BONES AND JOINTS: Left knee arthroplasty noted. Intact hardware. No acute fracture. No malalignment. Small joint effusion. SOFT TISSUES: The soft tissues are unremarkable. IMPRESSION: 1. 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 2. Small joint effusion. Electronically signed by: Donnice Mania MD 08/25/2024 11:59 PM EST RP Workstation: [id number]   DG Knee AP/LAT W/Sunrise Right Result Date: 08/25/2024 EXAM: 3 VIEW(S) XRAY OF THE R KNEE 08/13/2024 11:49:04 AM COMPARISON: 04/01/2019 CLINICAL HISTORY: R knee pain FINDINGS: BONES AND JOINTS: Total knee arthroplasty noted. Intact orthopedic hardware without periprosthetic fracture or  lucency. No acute fracture. No malalignment. Small joint effusion. SOFT TISSUES: The soft tissues are unremarkable. IMPRESSION: 1. Total knee arthroplasty with intact hardware. 2. Small joint effusion. Electronically signed by: Donnice Mania MD 08/25/2024 11:56 PM EST RP Workstation: [id number]   NM PET (PSMA)  SKULL TO MID THIGH Result Date: 08/25/2024 EXAM: PROSTATE PET SKULL BASE TO MID THIGHS 08/18/2024 03:38:43 PM TECHNIQUE: RADIOPHARMACEUTICAL: 8.63 mCi F-18 flotufolastaf (Posluma ) injected intravenously. PET imaging was obtained from skull vertex to mid thighs. Computed tomography was used for attenuation correction and localization. Fusion imaging was obtained. Uptake time  mins. COMPARISON: MRI prostate gland 06/05/2024. CLINICAL HISTORY: FINDINGS: PROSTATE AND PROSTATE BED: Corresponding to the MRI findings, there is a focal area of increased uptake in the posterior medial right mid gland peripheral zone, which measures 1.5 cm and has an SUV max of 16.5, axial image 190. LYMPH NODES: No tracer-avid lymph nodes within the soft tissues of the neck. Tracer avid node within the right hilar region with an SUV max of 7.3, axial image 74. No hypermetabolic abdominal activity or pelvic lymph nodes. BONES: Posttraumatic uptake is identified involving the anterior aspect of the right sixth rib and the anterior aspect of the left sixth and seventh ribs with underlying subacute to chronic rib deformities on the CT images. No tracer avid bone metastases identified. OTHER PET FINDINGS: No tracer avid pulmonary nodules. Coronary artery calcifications and aortic atherosclerosis. No abnormal tracer uptake within the liver, pancreas, spleen, or adrenal glands. Physiologic activity within the salivary glands, kidneys, bowel, and urinary bladder. IMPRESSION: 1. Focal area of increased uptake in the posterior medial right mid gland peripheral zone, corresponding to the MRI findings, measuring 1.5 cm with an SUV max of 16.5. 2. Tracer avid node within the right hilar region with an SUV max of 7.3. Suspicious for nodal metastasis. 3. No tracer avid bone metastases identified. Electronically signed by: Waddell Calk MD 08/25/2024 02:35 PM EST RP Workstation: [id number]      IMPRESSION/PLAN: 1. 73 y.o. gentleman with Stage T1c  adenocarcinoma of the prostate with Gleason Score of 4+3, and PSA of 6.59. We discussed the patient's workup and outlined the nature of prostate cancer in this setting. The patient's T stage, Gleason's score, and PSA put him into the unfavorable intermediate risk group. Accordingly, he is eligible for a variety of potential treatment options including prostatectomy, brachytherapy or 5.5 weeks of external radiation with or without ADT. We discussed the available radiation techniques, and focused on the details and logistics of delivery. We discussed and outlined the risks, benefits, short and long-term effects associated with radiotherapy and compared and contrasted these with prostatectomy. We discussed the role of SpaceOAR gel in reducing the rectal toxicity associated with radiotherapy. We also detailed the role of ST-ADT in the treatment of unfavorable intermediate risk prostate cancer and outlined the associated side effects that could be expected with this therapy.  He appears to have a good understanding of his disease and our treatment recommendations which are of curative intent.  He was encouraged to ask questions that were answered to his stated satisfaction.  At the conclusion of our conversation, the patient is undecided but appears to be leaning towards brachytherapy. We will reach out to pulmonology to get their opinion on the tracer avid hilar node seen on PSMA PET and whether this should be biopsied or just monitored with repeat imaging in 1-2 months. Pending further evaluation, he has our contact information and will let us  know once he reaches  a decision regarding treatment of the prostate cancer and we will proceed with treatment planning accordingly at that time.  We will share our discussion with Dr. Devere and he knows that he is welcome to call at any time in the interim with further questions or concerns.  We personally spent 70 minutes in this encounter including chart review, reviewing  radiological studies, meeting face-to-face with the patient, entering orders and completing documentation.    Sabra MICAEL Rusk, PA-C    Donnice Barge, MD  [facility]  Radiation Oncology Direct Dial: [phone]  Fax: [phone] [email or web address]  Skype  [email or web address]   This document serves as a record of services personally performed by Donnice Barge, MD and Sabra Rusk, PA-C. It was created on their behalf by Izetta Neither, a trained medical scribe. The creation of this record is based on the scribe's personal observations and the provider's statements to them. This document has been checked and approved by the attending provider.    "

## 2024-08-26 NOTE — Progress Notes (Signed)
 Introduced myself to the patient, and his wife, as the prostate nurse navigator. He is here to discuss his radiation treatment options and will proceed with brachytherapy. I provided patient with some written education and my direct contact information.  Patient knows to reach out with any questions or barriers that may arise.

## 2024-08-28 ENCOUNTER — Other Ambulatory Visit: Payer: Self-pay | Admitting: Urology

## 2024-08-28 DIAGNOSIS — R590 Localized enlarged lymph nodes: Secondary | ICD-10-CM

## 2024-08-29 NOTE — Progress Notes (Signed)
 RN spoke with patient and reviewed recommendations for CT Chest due to findings on PSMA PET.   RN provided patient with number to [facility].  Patient will call to schedule.  Will continue to follow.

## 2024-09-01 ENCOUNTER — Ambulatory Visit: Admitting: Family Medicine

## 2024-09-01 VITALS — BP 122/60 | HR 74 | Ht 69.0 in | Wt 214.0 lb

## 2024-09-01 DIAGNOSIS — M79642 Pain in left hand: Secondary | ICD-10-CM | POA: Diagnosis not present

## 2024-09-01 NOTE — Patient Instructions (Signed)
 Thank you for coming in today.   Schedule follow up shockwave treatments for once a week for the next 3 weeks

## 2024-09-01 NOTE — Progress Notes (Signed)
"       ° °  I, Claretha Schimke am a scribe for Dr. Artist Lloyd, MD.  James [name] is a 74 y.o. male who presents to [facility] at [facility] today for ECSWT consult for L thumb pain. Pt was last seen for his thumb by Dr. Claudene on 04/02/24. Pt locates pain to left hand in the thumb. Here to talk to doctor Lloyd about shockwave therapy.   Dx testing: 04/02/24 L hand XR & labs  Pertinent review of systems: No fevers or chills  Relevant historical information: History of prostate cancer   Exam:  BP 122/60   Pulse 74   Ht 5' 9 (1.753 m)   Wt 214 lb (97.1 kg)   SpO2 97%   BMI 31.60 kg/m  General: Well Developed, well nourished, and in no acute distress.   MSK: Left hand bossing first CMC.  Tender palpation in this region.  Normal thumb motion.    Lab and Radiology Results  X-ray images left hand obtained on April 02, 2024 personally and independently interpreted.  Degenerative changes with bone spur present at first [facility].                Extracorporeal Shockwave Therapy Note    Patient is being treated today with ECSWT. Informed consent was obtained and patient tolerated procedure well.   Therapy performed by Artist Lloyd  Condition treated: Left hand first CMC bones.  DJD Treatment preset used: Low-dose Energy used: 60 mJ Frequency used: 15 Hz Number of pulses: 2000 Head Size: Large Treatment #1  of #4     Assessment and Plan: 74 y.o. 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  Plan for trial of shockwave.  Initial trial today did not produce severe pain so we can try to continue it.  Will schedule for dedicated shockwave visits in the near future.  No charge for today shockwave.   PDMP not reviewed this encounter. No orders of the defined types were placed in this encounter.  No orders of the defined types were placed in this encounter.    Discussed warning signs or symptoms. Please see discharge instructions. Patient expresses  understanding.   The above documentation has been reviewed and is accurate and complete Artist Lloyd, M.D. Consultation discussion visit today. Total encounter time 20 minutes including face-to-face time with the patient and, reviewing past medical record, and charting on the date of service.   Time based billing performed for discussion component not for the shockwave component no charge for shockwave today.  "

## 2024-09-02 NOTE — Progress Notes (Signed)
 Patient is now set up for CT on 1/19.  RN will follow to ensure results are final and reported to patient and confirm treatment decision.

## 2024-09-04 ENCOUNTER — Telehealth: Payer: Self-pay | Admitting: *Deleted

## 2024-09-04 NOTE — Telephone Encounter (Signed)
 Returned patient's phone call, spoke with patient

## 2024-09-05 ENCOUNTER — Telehealth: Payer: Self-pay | Admitting: *Deleted

## 2024-09-05 NOTE — Telephone Encounter (Signed)
 Returned patient's phone call, spoke with patient

## 2024-09-09 ENCOUNTER — Telehealth: Payer: Self-pay | Admitting: *Deleted

## 2024-09-09 ENCOUNTER — Other Ambulatory Visit: Payer: Self-pay | Admitting: Urology

## 2024-09-09 ENCOUNTER — Ambulatory Visit: Payer: Self-pay | Admitting: Family Medicine

## 2024-09-09 DIAGNOSIS — M79642 Pain in left hand: Secondary | ICD-10-CM

## 2024-09-09 NOTE — Progress Notes (Signed)
"             ° °  [facility] [address] [place] [address] Phone: [phone]   Extracorporeal Shockwave Therapy Note    Patient is being treated today with ECSWT. Informed consent was obtained and patient tolerated procedure well.   Therapy performed by Artist Joane  Condition treated: Left hand pain Treatment preset used: Modification low-dose Energy used: 60 mJ Frequency used: 15 Hz Number of pulses: 2000 Head Size: Large Treatment #2 of #4  Electronically signed by:  [facility] 12:22 PM 09/09/2024  "

## 2024-09-09 NOTE — Telephone Encounter (Signed)
Called patient to inform of pre-seed appts. and implant date, spoke with patient and he is aware of these appts.

## 2024-09-09 NOTE — Patient Instructions (Signed)
 Thank you for coming in today.

## 2024-09-10 NOTE — Progress Notes (Incomplete)
 Follow up call to discuss results from chest CT scan on 09/15/24.  Nursing interview completed.  IMPRESSION: 1. Stable mildly prominent right hilar lymph node, nonspecific. Based on previous PSMA PET-CT, this could reflect a nodal metastasis. Recommend attention on follow-up. No other enlarged lymph nodes are demonstrated. 2. Part solid left lower lobe nodule appears larger and more conspicuous than on recent PET-CT, likely inflammatory/infectious. Recommend attention on follow-up. Additional tiny pulmonary nodules bilaterally. 3. No evidence of osseous metastatic disease. 4.  Aortic Atherosclerosis (ICD10-I70.0).

## 2024-09-11 ENCOUNTER — Other Ambulatory Visit: Payer: Self-pay | Admitting: Urology

## 2024-09-11 DIAGNOSIS — R590 Localized enlarged lymph nodes: Secondary | ICD-10-CM

## 2024-09-11 DIAGNOSIS — C61 Malignant neoplasm of prostate: Secondary | ICD-10-CM

## 2024-09-11 NOTE — Progress Notes (Addendum)
 RN left message for call back to review update on CT scan for 1/19.  RN spoke with patient and he is aware of the change for CT Scan.  All questions answered, no additional needs at this time.

## 2024-09-12 ENCOUNTER — Telehealth: Payer: Self-pay | Admitting: *Deleted

## 2024-09-12 NOTE — Telephone Encounter (Signed)
 CALLED PATIENT TO INFORM THAT CT SCHEDULED FOR 09-15-24 @ [facility] [place] HAS BEEN AUTH.  PATIENT VERIFIED UNDERSTANDING THIS

## 2024-09-15 ENCOUNTER — Ambulatory Visit

## 2024-09-15 ENCOUNTER — Other Ambulatory Visit

## 2024-09-15 DIAGNOSIS — R590 Localized enlarged lymph nodes: Secondary | ICD-10-CM

## 2024-09-15 DIAGNOSIS — C61 Malignant neoplasm of prostate: Secondary | ICD-10-CM

## 2024-09-15 MED ORDER — IOHEXOL 300 MG/ML  SOLN
100.0000 mL | Freq: Once | INTRAMUSCULAR | Status: AC | PRN
  Administered 2024-09-15: 75 mL via INTRAVENOUS

## 2024-09-16 ENCOUNTER — Telehealth: Payer: Self-pay

## 2024-09-16 ENCOUNTER — Ambulatory Visit

## 2024-09-16 ENCOUNTER — Ambulatory Visit: Payer: Self-pay | Admitting: Family Medicine

## 2024-09-16 VITALS — BP 118/66 | HR 65 | Ht 69.0 in | Wt 211.6 lb

## 2024-09-16 DIAGNOSIS — R590 Localized enlarged lymph nodes: Secondary | ICD-10-CM

## 2024-09-16 DIAGNOSIS — M79642 Pain in left hand: Secondary | ICD-10-CM

## 2024-09-16 DIAGNOSIS — Z87891 Personal history of nicotine dependence: Secondary | ICD-10-CM

## 2024-09-16 DIAGNOSIS — R918 Other nonspecific abnormal finding of lung field: Secondary | ICD-10-CM

## 2024-09-16 DIAGNOSIS — R911 Solitary pulmonary nodule: Secondary | ICD-10-CM | POA: Diagnosis not present

## 2024-09-16 NOTE — Telephone Encounter (Signed)
 Please schedule the following:  Provider performing procedure: Dr Pleas Diagnosis: R Hilar LAD Which side if for nodule / mass? Right  Procedure: Flex bronch + EBUS   Has patient been spoken to by Provider and given informed consent? Yes Anesthesia: General Do you need Fluro? No Duration of procedure: 45 minutes Date: 10/02/24  Time: AM Location: [facility]  Does patient have OSA? Yes DM? No Or Latex allergy? No Medication Restriction/ Anticoagulate/Antiplatelet: None Pre-op Labs Ordered:determined by Anesthesia Imaging request: None  (If, SuperDimension CT Chest, please have STAT courier sent to ENDO)

## 2024-09-16 NOTE — Telephone Encounter (Signed)
 Copied from CRM [id number]. Topic: Clinical - Request for Lab/Test Order >> Sep 16, 2024 11:51 AM Russell PARAS wrote: Reason for CRM:   Pt is reaching out to clinic regarding his AVS from most recent visit with Hattar. In note, advised to FU with Dr. Pleas at scheduled bronchoscopy on 2/5. Advised pt this may mean to make sure to keep appt for procedure and that the surgery center will typically reach out with any pre procedure instructions.   Requested call back to speak more about procedure  CB#  [phone], please leave detailed message if he does not answer   Spoke with patient VBU,   -NFN

## 2024-09-16 NOTE — Progress Notes (Signed)
"             ° °  [facility] [address] [place] [address] Phone: [phone]   Extracorporeal Shockwave Therapy Note    Patient is being treated today with ECSWT. Informed consent was obtained and patient tolerated procedure well.   Therapy performed by Artist Joane  Condition treated: Left hand pain Treatment preset used: Low dose modification Energy used: 60 mJ Frequency used: 15 Hz Number of pulses: 2000 Head Size: Large Treatment #3 of #4  Electronically signed by:  [facility] 1:32 PM 09/16/24  "

## 2024-09-16 NOTE — H&P (View-Only) (Signed)
 "   Subjective:   PATIENT ID: James [name] Sam GENDER: male DOB: Apr 13, 1951, MRN: [id number]   HPI Discussed the use of AI scribe software for clinical note transcription with the patient, who gave verbal consent to proceed.  History of Present Illness James [name] is a 74 year old male with prostate cancer who presents for evaluation of an enlarged right hilar lymph node.  He was diagnosed with prostate cancer in October 2025, confirmed by biopsy. A PET scan on August 18, 2024, revealed an enlarged right hilar lymph node that was metabolically active. A follow-up CT scan on September 15, 2024, confirmed the lymph node's enlargement, measuring approximately 1.2 cm.  Additionally, a small ground-glass opacity was noted at the left lung base on the CT scan, measuring about 8 mm.  He has a history of sleep apnea, which he manages by sleeping on his side, using Breathe Right strips, and adjusting his bed. He does not use a CPAP machine. He takes losartan  50 mg for hypertension and uses Tylenol for arthritis pain. He has undergone knee replacements and has a history of smoking, having quit in 2020 after smoking a pack a day for approximately 50 to 60 years.  No fevers, chills, night sweats, or unintentional weight loss. He reports weight gain since quitting smoking. No known diabetes or latex allergies. He does not take blood thinners or aspirin.     Past Medical History:  Diagnosis Date   Anterior basement membrane dystrophy    sees Dr. 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, excised      Family History  Problem Relation Age of Onset   Uterine cancer Mother    Breast cancer Mother 62   Colon cancer Neg Hx    Colon  polyps Neg Hx    Esophageal cancer Neg Hx    Rectal cancer Neg Hx    Stomach cancer Neg Hx      Social History   Socioeconomic History   Marital status: Married    Spouse name: Not on file   Number of children: Not on file   Years of education: Not on file   Highest education level: Not on file  Occupational History   Not on file  Tobacco Use   Smoking status: Former    Current packs/day: 1.00    Average packs/day: 1 pack/day for 55.0 years (55.0 ttl pk-yrs)    Types: Cigarettes    Start date: 09/16/1969   Smokeless tobacco: Never   Tobacco comments:    Quit in 2020   Vaping Use   Vaping status: Never Used  Substance and Sexual Activity   Alcohol use: Yes    Alcohol/week: 1.0 standard drink of alcohol    Types: 1 Standard drinks or equivalent per week    Comment: cocktail in evenings   Drug use: No   Sexual activity: Not on file  Other Topics Concern   Not on file  Social History Narrative   Not on file   Social Drivers of Health   Tobacco Use: Medium Risk (09/16/2024)   Patient History    Smoking Tobacco  Use: Former    Smokeless Tobacco Use: Never    Passive Exposure: Not on Actuary Strain: Not on file  Food Insecurity: No Food Insecurity (08/26/2024)   Epic    Worried About Programme Researcher, Broadcasting/film/video in the Last Year: Never true    Ran Out of Food in the Last Year: Never true  Transportation Needs: No Transportation Needs (08/26/2024)   Epic    Lack of Transportation (Medical): No    Lack of Transportation (Non-Medical): No  Physical Activity: Not on file  Stress: Not on file  Social Connections: Unknown (01/09/2022)   Received from [facility]   Social Network    Social Network: Not on file  Intimate Partner Violence: Not At Risk (08/26/2024)   Epic    Fear of Current or Ex-Partner: No    Emotionally Abused: No    Physically Abused: No    Sexually Abused: No  Depression (PHQ2-9): Low Risk (08/26/2024)   Depression (PHQ2-9)    PHQ-2  Score: 0  Alcohol Screen: Low Risk (08/26/2024)   Alcohol Screen    Last Alcohol Screening Score (AUDIT): 1  Housing: Low Risk (08/26/2024)   Epic    Unable to Pay for Housing in the Last Year: No    Number of Times Moved in the Last Year: 0    Homeless in the Last Year: No  Utilities: Not At Risk (08/26/2024)   Epic    Threatened with loss of utilities: No  Health Literacy: Not on file     Allergies[1]   Outpatient Medications Prior to Visit  Medication Sig Dispense Refill   Acetaminophen (TYLENOL ARTHRITIS PAIN PO) Take 1,500 mg by mouth as needed.     albuterol  (VENTOLIN  HFA) 108 (90 Base) MCG/ACT inhaler Inhale 2 puffs into the lungs.     cholecalciferol (VITAMIN D ) 1000 units tablet Take 1,000 Units by mouth daily.     FIBER PO Take by mouth daily. Organic fiber     Garlic 10 MG CAPS Take by mouth daily.     Glucosamine 500 MG CAPS Take by mouth.     losartan  (COZAAR ) 50 MG tablet Take 1 tablet (50 mg total) by mouth daily. 90 tablet 3   NON FORMULARY Takes organic vitamins     OVER THE COUNTER MEDICATION at bedtime. Tarte cherry     Saw Palmetto, Serenoa repens, (SAW PALMETTO PO) Take 320 mg by mouth daily at 12 noon.     TURMERIC PO Take 1,000 mg by mouth daily.     No facility-administered medications prior to visit.    ROS Reviewed all systems and reported negative except as above     Objective:   Vitals:   09/16/24 0909  BP: 118/66  Pulse: 65  SpO2: 99%  Weight: 211 lb 9.6 oz (96 kg)  Height: 5' 9 (1.753 m)    Physical Exam Physical Exam GENERAL: Appropriate to age, no acute distress. HEAD EYES EARS NOSE THROAT: Moist mucous membranes, atraumatic, normocephalic. CHEST: Clear to auscultation bilaterally, no wheezing, no crackles, no rales CARDIAC: Regular rate and rhythm, normal S1, normal S2, no murmurs, no rubs, no gallops. ABDOMEN: Soft, nontender. NEUROLOGICAL: Motor and sensation grossly intact, alert and oriented times X 3. EXTREMITIES: Warm,  well perfused, no edema.     CBC    Component Value Date/Time   WBC 7.0 04/02/2024 0923   RBC 4.01 (L) 04/02/2024 0923   HGB 13.6 04/02/2024 0923   HCT 39.3 04/02/2024 0923  PLT 206.0 04/02/2024 0923   MCV 97.9 04/02/2024 0923   MCHC 34.5 04/02/2024 0923   RDW 12.1 04/02/2024 0923   LYMPHSABS 2.9 04/02/2024 0923   MONOABS 0.8 04/02/2024 0923   EOSABS 0.2 04/02/2024 0923   BASOSABS 0.1 04/02/2024 9076     Results Radiology Chest PET (08/18/2024): Enlarged right hilar lymph node with increased FDG uptake; solitary ground glass nodule in left lung base. (Independently interpreted) Chest CT (09/15/2024): Enlarged right hilar lymph node measuring approximately 1.2 cm; solitary ground glass nodule in left lung base measuring approximately 8 mm, partially solid. (Independently interpreted)   PFT:     No data to display               Assessment & Plan:   Assessment and Plan Assessment & Plan Enlarged right hilar lymph node Right hilar lymph node enlarged with increased PET uptake, suggesting malignancy due to lack of infection or inflammation evidence. Discussed differential including reactive lymph node form infection which is less likely and malignancy - Scheduled bronchoscopy with biopsy on February 5th, 2026, with Dr. Pleas. - Discussed bronchoscopy risks: general anesthesia, bleeding (<1%), pneumothorax (<1%), pneumonia (<1%), overall complication risk 2%.  Solitary ground glass pulmonary nodule, left lung 8 mm ground glass nodule in left lung, likely inflammatory, too small for biopsy. - Monitor with follow-up CT scan in 6 months.        Zola Herter, MD [facility] Office: [phone]       [1]  Allergies Allergen Reactions   Penicillins     Respiratory reaction SOB   "

## 2024-09-16 NOTE — Patient Instructions (Signed)
 Return as scheduled next week.  Please let me know if you have any problems.

## 2024-09-16 NOTE — Progress Notes (Signed)
 Patient had CT scan done yesterday, 1/19, and meet with Dr. Donzetta today, 1/20, for pulm consult.  CT scan showed concerns and Dr. Donzetta is going to proceed with bronchoscopy with biopsy on February 5th, 2026, with Dr. Pleas.  RN will continue to follow the patient after bronchoscopy to ensure no changes are needed to the brachytherapy treatment schedule for prostate cancer.

## 2024-09-16 NOTE — Telephone Encounter (Signed)
 Letter given by Sonny. Case # [id number]. Sending to [employer] to [facility]

## 2024-09-16 NOTE — Progress Notes (Signed)
 "   Subjective:   PATIENT ID: James [name] GENDER: male DOB: Apr 13, 1951, MRN: [id number]   HPI Discussed the use of AI scribe software for clinical note transcription with the patient, who gave verbal consent to proceed.  History of Present Illness [name] is a 74 year old male with prostate cancer who presents for evaluation of an enlarged right hilar lymph node.  He was diagnosed with prostate cancer in October 2025, confirmed by biopsy. A PET scan on August 18, 2024, revealed an enlarged right hilar lymph node that was metabolically active. A follow-up CT scan on September 15, 2024, confirmed the lymph node's enlargement, measuring approximately 1.2 cm.  Additionally, a small ground-glass opacity was noted at the left lung base on the CT scan, measuring about 8 mm.  He has a history of sleep apnea, which he manages by sleeping on his side, using Breathe Right strips, and adjusting his bed. He does not use a CPAP machine. He takes losartan  50 mg for hypertension and uses Tylenol for arthritis pain. He has undergone knee replacements and has a history of smoking, having quit in 2020 after smoking a pack a day for approximately 50 to 60 years.  No fevers, chills, night sweats, or unintentional weight loss. He reports weight gain since quitting smoking. No known diabetes or latex allergies. He does not take blood thinners or aspirin.     Past Medical History:  Diagnosis Date   Anterior basement membrane dystrophy    sees Dr. 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, excised      Family History  Problem Relation Age of Onset   Uterine cancer Mother    Breast cancer Mother 62   Colon cancer Neg Hx    Colon  polyps Neg Hx    Esophageal cancer Neg Hx    Rectal cancer Neg Hx    Stomach cancer Neg Hx      Social History   Socioeconomic History   Marital status: Married    Spouse name: Not on file   Number of children: Not on file   Years of education: Not on file   Highest education level: Not on file  Occupational History   Not on file  Tobacco Use   Smoking status: Former    Current packs/day: 1.00    Average packs/day: 1 pack/day for 55.0 years (55.0 ttl pk-yrs)    Types: Cigarettes    Start date: 09/16/1969   Smokeless tobacco: Never   Tobacco comments:    Quit in 2020   Vaping Use   Vaping status: Never Used  Substance and Sexual Activity   Alcohol use: Yes    Alcohol/week: 1.0 standard drink of alcohol    Types: 1 Standard drinks or equivalent per week    Comment: cocktail in evenings   Drug use: No   Sexual activity: Not on file  Other Topics Concern   Not on file  Social History Narrative   Not on file   Social Drivers of Health   Tobacco Use: Medium Risk (09/16/2024)   Patient History    Smoking Tobacco  Use: Former    Smokeless Tobacco Use: Never    Passive Exposure: Not on Actuary Strain: Not on file  Food Insecurity: No Food Insecurity (08/26/2024)   Epic    Worried About Programme Researcher, Broadcasting/film/video in the Last Year: Never true    Ran Out of Food in the Last Year: Never true  Transportation Needs: No Transportation Needs (08/26/2024)   Epic    Lack of Transportation (Medical): No    Lack of Transportation (Non-Medical): No  Physical Activity: Not on file  Stress: Not on file  Social Connections: Unknown (01/09/2022)   Received from [facility]   Social Network    Social Network: Not on file  Intimate Partner Violence: Not At Risk (08/26/2024)   Epic    Fear of Current or Ex-Partner: No    Emotionally Abused: No    Physically Abused: No    Sexually Abused: No  Depression (PHQ2-9): Low Risk (08/26/2024)   Depression (PHQ2-9)    PHQ-2  Score: 0  Alcohol Screen: Low Risk (08/26/2024)   Alcohol Screen    Last Alcohol Screening Score (AUDIT): 1  Housing: Low Risk (08/26/2024)   Epic    Unable to Pay for Housing in the Last Year: No    Number of Times Moved in the Last Year: 0    Homeless in the Last Year: No  Utilities: Not At Risk (08/26/2024)   Epic    Threatened with loss of utilities: No  Health Literacy: Not on file     Allergies[1]   Outpatient Medications Prior to Visit  Medication Sig Dispense Refill   Acetaminophen (TYLENOL ARTHRITIS PAIN PO) Take 1,500 mg by mouth as needed.     albuterol  (VENTOLIN  HFA) 108 (90 Base) MCG/ACT inhaler Inhale 2 puffs into the lungs.     cholecalciferol (VITAMIN D ) 1000 units tablet Take 1,000 Units by mouth daily.     FIBER PO Take by mouth daily. Organic fiber     Garlic 10 MG CAPS Take by mouth daily.     Glucosamine 500 MG CAPS Take by mouth.     losartan  (COZAAR ) 50 MG tablet Take 1 tablet (50 mg total) by mouth daily. 90 tablet 3   NON FORMULARY Takes organic vitamins     OVER THE COUNTER MEDICATION at bedtime. Tarte cherry     Saw Palmetto, Serenoa repens, (SAW PALMETTO PO) Take 320 mg by mouth daily at 12 noon.     TURMERIC PO Take 1,000 mg by mouth daily.     No facility-administered medications prior to visit.    ROS Reviewed all systems and reported negative except as above     Objective:   Vitals:   09/16/24 0909  BP: 118/66  Pulse: 65  SpO2: 99%  Weight: 211 lb 9.6 oz (96 kg)  Height: 5' 9 (1.753 m)    Physical Exam Physical Exam GENERAL: Appropriate to age, no acute distress. HEAD EYES EARS NOSE THROAT: Moist mucous membranes, atraumatic, normocephalic. CHEST: Clear to auscultation bilaterally, no wheezing, no crackles, no rales CARDIAC: Regular rate and rhythm, normal S1, normal S2, no murmurs, no rubs, no gallops. ABDOMEN: Soft, nontender. NEUROLOGICAL: Motor and sensation grossly intact, alert and oriented times X 3. EXTREMITIES: Warm,  well perfused, no edema.     CBC    Component Value Date/Time   WBC 7.0 04/02/2024 0923   RBC 4.01 (L) 04/02/2024 0923   HGB 13.6 04/02/2024 0923   HCT 39.3 04/02/2024 0923  PLT 206.0 04/02/2024 0923   MCV 97.9 04/02/2024 0923   MCHC 34.5 04/02/2024 0923   RDW 12.1 04/02/2024 0923   LYMPHSABS 2.9 04/02/2024 0923   MONOABS 0.8 04/02/2024 0923   EOSABS 0.2 04/02/2024 0923   BASOSABS 0.1 04/02/2024 9076     Results Radiology Chest PET (08/18/2024): Enlarged right hilar lymph node with increased FDG uptake; solitary ground glass nodule in left lung base. (Independently interpreted) Chest CT (09/15/2024): Enlarged right hilar lymph node measuring approximately 1.2 cm; solitary ground glass nodule in left lung base measuring approximately 8 mm, partially solid. (Independently interpreted)   PFT:     No data to display               Assessment & Plan:   Assessment and Plan Assessment & Plan Enlarged right hilar lymph node Right hilar lymph node enlarged with increased PET uptake, suggesting malignancy due to lack of infection or inflammation evidence. Discussed differential including reactive lymph node form infection which is less likely and malignancy - Scheduled bronchoscopy with biopsy on February 5th, 2026, with Dr. Pleas. - Discussed bronchoscopy risks: general anesthesia, bleeding (<1%), pneumothorax (<1%), pneumonia (<1%), overall complication risk 2%.  Solitary ground glass pulmonary nodule, left lung 8 mm ground glass nodule in left lung, likely inflammatory, too small for biopsy. - Monitor with follow-up CT scan in 6 months.        Zola Herter, MD [facility] Office: [phone]       [1]  Allergies Allergen Reactions   Penicillins     Respiratory reaction SOB   "

## 2024-09-16 NOTE — Patient Instructions (Signed)
" °  VISIT SUMMARY: During your visit, we discussed the evaluation of your enlarged right hilar lymph node and the small ground-glass opacity in your left lung. We also reviewed your history of prostate cancer, sleep apnea, hypertension, arthritis, and smoking cessation.  YOUR PLAN: -ENLARGED RIGHT HILAR LYMPH NODE: The enlarged right hilar lymph node, which showed increased activity on the PET scan, suggests a possible malignancy. We have scheduled a bronchoscopy with biopsy on February 5th, 2026, with Dr. Pleas to further investigate. The procedure involves general anesthesia and carries risks such as bleeding, pneumothorax, and pneumonia, but the overall complication risk is low at 2%.  -SOLITARY GROUND GLASS PULMONARY NODULE, LEFT LUNG: The small 8 mm ground-glass nodule in your left lung is likely inflammatory and too small for a biopsy at this time. We will monitor it with a follow-up CT scan in 6 months to ensure there are no changes.  INSTRUCTIONS: Please follow up with Dr. Pleas for your scheduled bronchoscopy on February 5th, 2026. Additionally, we will need to schedule a follow-up CT scan in 6 months to monitor the ground-glass nodule in your left lung.    Contains text generated by [employer].   "

## 2024-09-17 ENCOUNTER — Ambulatory Visit
Admission: RE | Admit: 2024-09-17 | Discharge: 2024-09-17 | Disposition: A | Discharge status: Home / Self Care | Source: Ambulatory Visit | Attending: Urology | Admitting: Urology

## 2024-09-17 DIAGNOSIS — C61 Malignant neoplasm of prostate: Secondary | ICD-10-CM

## 2024-09-17 DIAGNOSIS — R590 Localized enlarged lymph nodes: Secondary | ICD-10-CM

## 2024-09-17 NOTE — Addendum Note (Signed)
 Encounter addended by: Albino Dyke FALCON, LPN on: 8/78/7973 10:30 AM  Actions taken: Delete clinical note

## 2024-09-17 NOTE — Addendum Note (Signed)
 Encounter addended by: Albino Dyke FALCON, LPN on: 8/78/7973 10:32 AM  Actions taken: Check Out activity completed

## 2024-09-22 ENCOUNTER — Encounter: Payer: Self-pay | Admitting: Family Medicine

## 2024-09-23 ENCOUNTER — Ambulatory Visit: Admitting: Family Medicine

## 2024-09-30 ENCOUNTER — Encounter (HOSPITAL_COMMUNITY): Payer: Self-pay

## 2024-09-30 NOTE — Progress Notes (Signed)
 PCP - Dr Garnette Olmsted Cardiologist - none  CT Chest x-ray - 09/15/24 EKG - DOS Stress Test - n/a ECHO - n/a Cardiac Cath - n/a  ICD Pacemaker/Loop - n/a  Sleep Study -  none  Diabetes - n/a  Aspirin & Blood Thinner Instructions:  n/a  NPO  Anesthesia review: Yes  STOP now taking any Aspirin (unless otherwise instructed by your surgeon), Aleve, Naproxen, Ibuprofen, Motrin, Advil, Goody's, BC's, all herbal medications, fish oil, and all vitamins.   Coronavirus Screening Do you have any of the following symptoms:  Cough yes/no: No Fever (>100.58F)  yes/no: No Runny nose yes/no: No Sore throat yes/no: No Difficulty breathing/shortness of breath  occasional   Have you traveled in the last 14 days and where? yes/no: No  Patient verbalized understanding of instructions that were given via phone.

## 2024-10-01 ENCOUNTER — Encounter (HOSPITAL_COMMUNITY): Payer: Self-pay

## 2024-10-01 ENCOUNTER — Other Ambulatory Visit: Payer: Self-pay

## 2024-10-01 NOTE — Progress Notes (Signed)
 Anesthesia Chart Review: CANDELARIA MICHAE POLITE  Case: [id number] Date/Time: 10/02/24 0915   Procedure: BRONCHOSCOPY, WITH EBUS (Bilateral)   Anesthesia type: General   Diagnosis: Hilar lymphadenopathy [R59.0]   Pre-op diagnosis: Right hilar LAD   [facility]: [facility] / [facility]   Surgeons: Pleas Newborn, MD       DISCUSSION: Patient is a 74 year old male scheduled for the above procedure. Recently diagnosed with prostate cancer. PET scan in 07/2024 showed a metabolically active right hilar lymph node, enlarged on January CT with a small ground-glass opacity in the left lung base and was referred to pulmonologist Dr. Zaida. Hilar nod suspicious for malignancy, so above procedure planned. Left lung base felt likely inflammatory and too small for biopsy, follow-up CT in six months.    Other history includes former smoker (quit 2020), COPD, childhood asthma, HTN, exertional dyspnea, osteoarthritis (left TKA, right TKA), prostate cancer (diagnosed 05/2024), persistent thymus hyperplasia (s/p childhood radiation). Does not use CPAP.  Anesthesia team to evaluate on the day of surgery. Updated labs and EKG on arrival as indicated.   VS: Ht 5' 9 (1.753 m)   Wt 97.5 kg   BMI 31.75 kg/m   Temp Readings from Last 3 Encounters:  08/26/24 (!) 36.2 C (Temporal)  07/22/24 36.9 C (Oral)  11/27/22 36.6 C (Oral)   BP Readings from Last 3 Encounters:  09/16/24 118/66  09/01/24 122/60  08/26/24 137/82   Pulse Readings from Last 3 Encounters:  09/16/24 65  09/01/24 74  08/26/24 75     PROVIDERS: Johnny Garnette LABOR, MD is PCP  Zaida Mason, MD is pulmonologist Patrcia Cough, MD is [facility] Devere Bruckner, MD is urologist  LABS: For day of surgery as indicated. Most recent results in [facility] include: Lab Results  Component Value Date   WBC 7.0 04/02/2024   HGB 13.6 04/02/2024   HCT 39.3 04/02/2024   PLT 206.0 04/02/2024   GLUCOSE 104 (H) 04/02/2024   CHOL 177 07/22/2024    TRIG 180.0 (H) 07/22/2024   HDL 38.20 (L) 07/22/2024   LDLCALC 103 (H) 07/22/2024   ALT 27 04/02/2024   AST 28 04/02/2024   NA 140 04/02/2024   K 4.1 04/02/2024   CL 106 04/02/2024   CREATININE 1.00 04/02/2024   BUN 15 04/02/2024   CO2 26 04/02/2024   TSH 1.53 04/02/2024   PSA 6.59 (H) 04/02/2024   HGBA1C 5.9 07/22/2024    IMAGES: CT Chest 09/15/2024: IMPRESSION: 1. Stable mildly prominent right hilar lymph node, nonspecific. Based on previous PSMA PET-CT, this could reflect a nodal metastasis. Recommend attention on follow-up. No other enlarged lymph nodes are demonstrated. 2. Part solid left lower lobe nodule appears larger and more conspicuous than on recent PET-CT, likely inflammatory/infectious. Recommend attention on follow-up. Additional tiny pulmonary nodules bilaterally. 3. No evidence of osseous metastatic disease. 4.  Aortic Atherosclerosis (ICD10-I70.0).   PET Scan 08/18/2024: IMPRESSION: 1. Focal area of increased uptake in the posterior medial right mid gland peripheral zone, corresponding to the MRI findings, measuring 1.5 cm with an SUV max of 16.5. 2. Tracer avid node within the right hilar region with an SUV max of 7.3. Suspicious for nodal metastasis. 3. No tracer avid bone metastases identified.  EKG: Last EKG noted was from 06/10/2021: NSR   CV: N/A  Past Medical History:  Diagnosis Date   Anterior basement membrane dystrophy    sees Dr. 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 Milan    COLONOSCOPY  09/10/2019   per Dr. Teressa, adenimatous polyp, repeat in 7 yrs   EYE SURGERY Left    laser procedure   HERNIA REPAIR     umbilical   JOINT REPLACEMENT Bilateral    knees   sqamous cell     right eyebrow excised    SUPERFICIAL KERATECTOMY Right 2015   per Dr. Meridee     MEDICATIONS:  acetaminophen  (TYLENOL ) 325 MG tablet   Oxymetazoline HCl (AFRIN NASAL SPRAY NA)   Acetaminophen  (TYLENOL  ARTHRITIS PAIN PO)   albuterol  (VENTOLIN  HFA) 108 (90 Base) MCG/ACT inhaler   cholecalciferol (VITAMIN D ) 1000 units tablet   FIBER PO   Garlic 10 MG CAPS   Glucosamine 500 MG CAPS   losartan  (COZAAR ) 50 MG tablet   NON FORMULARY   OVER THE COUNTER MEDICATION   Saw Palmetto, Serenoa repens, (SAW PALMETTO PO)   TURMERIC PO   Isaiah Ruder, PA-C Surgical Short Stay/Anesthesiology [facility] Phone [phone] [facility] Phone [phone] 10/01/2024 4:36 PM

## 2024-10-01 NOTE — Anesthesia Preprocedure Evaluation (Signed)
"                                    Anesthesia Evaluation  Patient identified by MRN, date of birth, ID band Patient awake    Reviewed: Allergy & Precautions, NPO status , Patient's Chart, lab work & pertinent test results  History of Anesthesia Complications Negative for: history of anesthetic complications  Airway Mallampati: I  TM Distance: >3 FB Neck ROM: Full    Dental  (+) Dental Advisory Given   Pulmonary asthma , COPD,  COPD inhaler, former smoker   Pulmonary exam normal        Cardiovascular hypertension, Pt. on medications Normal cardiovascular exam     Neuro/Psych negative neurological ROS  negative psych ROS   GI/Hepatic negative GI ROS, Neg liver ROS,,,  Endo/Other   Obesity   Renal/GU negative Renal ROS    Prostate cancer     Musculoskeletal  (+) Arthritis ,    Abdominal   Peds  Hematology negative hematology ROS (+)   Anesthesia Other Findings   Reproductive/Obstetrics                              Anesthesia Physical Anesthesia Plan  ASA: 3  Anesthesia Plan: General   Post-op Pain Management: Tylenol  PO (pre-op)* and Minimal or no pain anticipated   Induction: Intravenous  PONV Risk Score and Plan: 2 and Treatment may vary due to age or medical condition, Ondansetron  and Dexamethasone   Airway Management Planned: Oral ETT  Additional Equipment: None  Intra-op Plan:   Post-operative Plan: Extubation in OR  Informed Consent: I have reviewed the patients History and Physical, chart, labs and discussed the procedure including the risks, benefits and alternatives for the proposed anesthesia with the patient or authorized representative who has indicated his/her understanding and acceptance.     Dental advisory given  Plan Discussed with: CRNA and Anesthesiologist  Anesthesia Plan Comments: (PAT note written 10/01/2024 by Malaney Mcbean, PA-C.)         Anesthesia Quick  Evaluation  "

## 2024-10-02 ENCOUNTER — Encounter (HOSPITAL_COMMUNITY): Admission: RE | Disposition: A | Discharge status: Home / Self Care | Payer: Self-pay | Source: Home / Self Care

## 2024-10-02 ENCOUNTER — Encounter (HOSPITAL_COMMUNITY): Payer: Self-pay | Admitting: Vascular Surgery

## 2024-10-02 ENCOUNTER — Ambulatory Visit (HOSPITAL_COMMUNITY): Admission: RE | Admit: 2024-10-02 | Discharge: 2024-10-02 | Disposition: A | Source: Home / Self Care

## 2024-10-02 ENCOUNTER — Encounter (HOSPITAL_COMMUNITY): Payer: Self-pay

## 2024-10-02 ENCOUNTER — Other Ambulatory Visit: Payer: Self-pay

## 2024-10-02 DIAGNOSIS — R590 Localized enlarged lymph nodes: Secondary | ICD-10-CM | POA: Diagnosis not present

## 2024-10-02 DIAGNOSIS — R942 Abnormal results of pulmonary function studies: Secondary | ICD-10-CM

## 2024-10-02 HISTORY — DX: Dyspnea, unspecified: R06.00

## 2024-10-02 HISTORY — DX: Essential (primary) hypertension: I10

## 2024-10-02 HISTORY — DX: Chronic obstructive pulmonary disease, unspecified: J44.9

## 2024-10-02 LAB — CBC
HCT: 40.2 % (ref 39.0–52.0)
Hemoglobin: 14.1 g/dL (ref 13.0–17.0)
MCH: 34.4 pg — ABNORMAL HIGH (ref 26.0–34.0)
MCHC: 35.1 g/dL (ref 30.0–36.0)
MCV: 98 fL (ref 80.0–100.0)
Platelets: 213 10*3/uL (ref 150–400)
RBC: 4.1 MIL/uL — ABNORMAL LOW (ref 4.22–5.81)
RDW: 11.4 % — ABNORMAL LOW (ref 11.5–15.5)
WBC: 8.8 10*3/uL (ref 4.0–10.5)
nRBC: 0 % (ref 0.0–0.2)

## 2024-10-02 LAB — BASIC METABOLIC PANEL WITH GFR
Anion gap: 9 (ref 5–15)
BUN: 11 mg/dL (ref 8–23)
CO2: 24 mmol/L (ref 22–32)
Calcium: 9.2 mg/dL (ref 8.9–10.3)
Chloride: 107 mmol/L (ref 98–111)
Creatinine, Ser: 0.9 mg/dL (ref 0.61–1.24)
GFR, Estimated: >60 mL/min
Glucose, Bld: 148 mg/dL — ABNORMAL HIGH (ref 70–99)
Potassium: 4.1 mmol/L (ref 3.5–5.1)
Sodium: 140 mmol/L (ref 135–145)

## 2024-10-02 LAB — PROTIME-INR
INR: 1 (ref 0.8–1.2)
Prothrombin Time: 13.3 s (ref 11.4–15.2)

## 2024-10-02 LAB — APTT: aPTT: 29 s (ref 24–36)

## 2024-10-02 MED ORDER — ONDANSETRON HCL 4 MG/2ML IJ SOLN: 4.0000 mg | Freq: Once | INTRAMUSCULAR | Status: DC | PRN

## 2024-10-02 MED ORDER — ONDANSETRON HCL 4 MG/2ML IJ SOLN
INTRAMUSCULAR | Status: DC | PRN
  Administered 2024-10-02: 4 mg via INTRAVENOUS

## 2024-10-02 MED ORDER — LACTATED RINGERS IV SOLN: INTRAVENOUS | Status: DC

## 2024-10-02 MED ORDER — PROPOFOL 10 MG/ML IV BOLUS
INTRAVENOUS | Status: DC | PRN
  Administered 2024-10-02: 150 mg via INTRAVENOUS

## 2024-10-02 MED ORDER — OXYCODONE HCL 5 MG PO TABS: 5.0000 mg | ORAL_TABLET | Freq: Once | ORAL | Status: DC | PRN

## 2024-10-02 MED ORDER — ROCURONIUM BROMIDE 10 MG/ML (PF) SYRINGE
PREFILLED_SYRINGE | INTRAVENOUS | Status: DC | PRN
  Administered 2024-10-02: 50 mg via INTRAVENOUS

## 2024-10-02 MED ORDER — FENTANYL CITRATE (PF) 100 MCG/2ML IJ SOLN: 25.0000 ug | INTRAMUSCULAR | Status: DC | PRN

## 2024-10-02 MED ORDER — CHLORHEXIDINE GLUCONATE 0.12 % MT SOLN
15.0000 mL | Freq: Once | OROMUCOSAL | Status: AC
  Administered 2024-10-02: 15 mL via OROMUCOSAL
  Filled 2024-10-02: qty 15

## 2024-10-02 MED ORDER — PHENYLEPHRINE 80 MCG/ML (10ML) SYRINGE FOR IV PUSH (FOR BLOOD PRESSURE SUPPORT)
PREFILLED_SYRINGE | INTRAVENOUS | Status: DC | PRN
  Administered 2024-10-02 (×3): 160 ug via INTRAVENOUS

## 2024-10-02 MED ORDER — LIDOCAINE 2% (20 MG/ML) 5 ML SYRINGE
INTRAMUSCULAR | Status: DC | PRN
  Administered 2024-10-02: 60 mg via INTRAVENOUS

## 2024-10-02 MED ORDER — OXYCODONE HCL 5 MG/5ML PO SOLN: 5.0000 mg | Freq: Once | ORAL | Status: DC | PRN

## 2024-10-02 MED ORDER — ACETAMINOPHEN 500 MG PO TABS
1000.0000 mg | ORAL_TABLET | Freq: Once | ORAL | Status: AC
  Administered 2024-10-02: 1000 mg via ORAL
  Filled 2024-10-02: qty 2

## 2024-10-02 MED ORDER — SUGAMMADEX SODIUM 200 MG/2ML IV SOLN
INTRAVENOUS | Status: DC | PRN
  Administered 2024-10-02: 200 mg via INTRAVENOUS

## 2024-10-02 MED ORDER — DEXAMETHASONE SOD PHOSPHATE PF 10 MG/ML IJ SOLN
INTRAMUSCULAR | Status: DC | PRN
  Administered 2024-10-02: 10 mg via INTRAVENOUS

## 2024-10-02 MED ORDER — PROPOFOL 500 MG/50ML IV EMUL
INTRAVENOUS | Status: DC | PRN
  Administered 2024-10-02: 125 ug/kg/min via INTRAVENOUS
  Administered 2024-10-02: 50 ug/kg/min via INTRAVENOUS

## 2024-10-02 NOTE — Procedures (Signed)
 Video Bronchoscopy with Endobronchial Ultrasound Procedure Note  Date of Operation: 10/02/2024  Pre-op Diagnosis: abnormal PET scan, prostate cancer  Post-op Diagnosis: abnormal PET. Prostate cancer  Surgeon: Cole Eastridge  Assistants:   Anesthesia: General endotracheal anesthesia  Operation: Flexible video fiberoptic bronchoscopy with endobronchial ultrasound and biopsies.  Estimated Blood Loss: Minimal  Complications: none  Indications and History: James [name] is a 74 y.o. male with history of prostate cancer, abnormal PET scan.  Recommendation made to achieve a tissue diagnosis via endobronchial ultrasound with biopsies.  The risks, benefits, complications, treatment options and expected outcomes were discussed with the patient.  The possibilities of pneumothorax, pneumonia, reaction to medication, pulmonary aspiration, perforation of a viscus, bleeding, failure to diagnose a condition and creating a complication requiring transfusion or operation were discussed with the patient who freely signed the consent.    Description of Procedure: The patient was examined in the preoperative area and history and data from the preprocedure consultation were reviewed. It was deemed appropriate to proceed.  The patient was taken to [facility] Endoscopy room 3, identified as James [name] and the procedure verified as Flexible Video Fiberoptic Bronchoscopy.  A Time Out was held and the above information confirmed. After being taken to the operating room general anesthesia was initiated and the patient  was orally intubated. The video fiberoptic bronchoscope was introduced via the endotracheal tube and a general inspection was performed which showed minimal secretions. The standard scope was then withdrawn and the endobronchial ultrasound was used to identify and characterize the peritracheal, hilar and bronchial lymph nodes. Inspection showed no mediastinal lymphadenopathy. Using real-time  ultrasound guidance Wang needle biopsies were take from Station 12 R nodes which was 9 mm in size and were sent for cytology. The patient tolerated the procedure well without apparent complications. There was no significant blood loss. The bronchoscope was withdrawn. Anesthesia was reversed and the patient was taken to the PACU for recovery.   Samples: 1. Wang needle biopsies from 12 R  node- 5 passes  Plans:  The patient will be discharged from the PACU to home when recovered from anesthesia. We will review the cytology results with the patient when they become available. Outpatient followup will be with James [name].    Krina Mraz 10/02/2024

## 2024-10-02 NOTE — Interval H&P Note (Signed)
 History and Physical Interval Note:  10/02/2024 7:29 AM  James [name]  has presented today for surgery, with the diagnosis of Right hilar LAD.  The various methods of treatment have been discussed with the patient and family. After consideration of risks, benefits and other options for treatment, the patient has consented to  Procedures: BRONCHOSCOPY, WITH EBUS (Bilateral) as a surgical intervention.  The patient's history has been reviewed, patient examined, no change in status, stable for surgery.  I have reviewed the patient's chart and labs.  Questions were answered to the patient's satisfaction.     Aarsh Fristoe Pleas

## 2024-10-02 NOTE — Progress Notes (Incomplete)
 Pre-seed nursing interview for a diagnosis of 74 y.o. gentleman with Stage T1c adenocarcinoma of the prostate with Gleason score of 4+3, and PSA of 6.59.   Patient identity verified x2.   Patient states issues as follows...  -Pain: *** -Fatigue: *** -Abdomen: *** -Groin: *** -Urinary: *** -Bowels: *** -Appetite: *** -Weight:  Patient denies all other related issues at this time.  Meaningful use complete.  Urinary Management medication(s)- None Urology appointment date- 10/28/2024, with Dr. Lonni Han   No vitals needed for this visit.  This concludes the interaction.  NURSE REMINDER: START 'PRE-SEED' EDUCATION VIDEO AT 4:25 mins.

## 2024-10-02 NOTE — Anesthesia Postprocedure Evaluation (Signed)
"   Anesthesia Post Note  Patient: James [name]  Procedure(s) Performed: BRONCHOSCOPY, WITH EBUS (Bilateral)     Patient location during evaluation: PACU Anesthesia Type: General Level of consciousness: awake and alert Pain management: pain level controlled Vital Signs Assessment: post-procedure vital signs reviewed and stable Respiratory status: spontaneous breathing, nonlabored ventilation and respiratory function stable Cardiovascular status: stable and blood pressure returned to baseline Anesthetic complications: no   There were no known notable events for this encounter.  Last Vitals:  Vitals:   10/02/24 1015 10/02/24 1030  BP: (!) 115/96 112/68  Pulse: 71 69  Resp: 10 15  Temp:    SpO2: 97% 96%    Last Pain:  Vitals:   10/02/24 1030  TempSrc:   PainSc: 0-No pain                 Debby FORBES Like      "

## 2024-10-02 NOTE — Transfer of Care (Signed)
 Immediate Anesthesia Transfer of Care Note  Patient: James [name]  Procedure(s) Performed: BRONCHOSCOPY, WITH EBUS (Bilateral)  Patient Location: PACU  Anesthesia Type:General  Level of Consciousness: awake, alert , and oriented  Airway & Oxygen Therapy: Patient Spontanous Breathing  Post-op Assessment: Report given to RN and Post -op Vital signs reviewed and stable  Post vital signs: Reviewed and stable  Last Vitals:  Vitals Value Taken Time  BP 104/76 10/02/24 10:09  Temp    Pulse 67 10/02/24 10:11  Resp    SpO2 96 % 10/02/24 10:11  Vitals shown include unfiled device data.  Last Pain:  Vitals:   10/02/24 0740  TempSrc:   PainSc: 0-No pain         Complications: There were no known notable events for this encounter.

## 2024-10-02 NOTE — Anesthesia Procedure Notes (Signed)
 Procedure Name: Intubation Date/Time: 10/02/2024 9:12 AM  Performed by: Alen Motto D, CRNAPre-anesthesia Checklist: Patient identified, Emergency Drugs available, Suction available and Patient being monitored Patient Re-evaluated:Patient Re-evaluated prior to induction Oxygen Delivery Method: Circle System Utilized Preoxygenation: Pre-oxygenation with 100% oxygen Induction Type: IV induction Ventilation: Mask ventilation without difficulty Laryngoscope Size: Mac and 4 Grade View: Grade I Tube type: Oral Tube size: 9.0 mm Number of attempts: 1 Airway Equipment and Method: Stylet Placement Confirmation: ETT inserted through vocal cords under direct vision, positive ETCO2 and breath sounds checked- equal and bilateral Secured at: 23 cm Tube secured with: Tape Dental Injury: Teeth and Oropharynx as per pre-operative assessment

## 2024-10-09 ENCOUNTER — Ambulatory Visit: Admitting: Radiation Oncology

## 2024-10-09 ENCOUNTER — Ambulatory Visit: Admitting: Urology

## 2024-10-10 ENCOUNTER — Ambulatory Visit: Admitting: Acute Care

## 2024-10-28 ENCOUNTER — Ambulatory Visit (HOSPITAL_COMMUNITY): Admit: 2024-10-28 | Admitting: Urology

## 2024-10-28 SURGERY — INSERTION, RADIATION SOURCE, PROSTATE
Anesthesia: General

## 2024-11-11 ENCOUNTER — Ambulatory Visit: Admitting: Family Medicine

## 2025-07-28 ENCOUNTER — Encounter: Admitting: Family Medicine
